# Patient Record
Sex: Female | Born: 1965 | Race: White | Marital: Married | Smoking: Never smoker
Health system: Southern US, Community
[De-identification: ages and names within clinical notes are randomized; demographics above are authoritative.]

## PROBLEM LIST (undated history)

## (undated) DIAGNOSIS — M51369 Other intervertebral disc degeneration, lumbar region without mention of lumbar back pain or lower extremity pain: Secondary | ICD-10-CM

## (undated) DIAGNOSIS — M5136 Other intervertebral disc degeneration, lumbar region: Secondary | ICD-10-CM

---
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
## 2007-08-01 HISTORY — PX: CERVICAL FUSION: SHX112

## 2010-11-12 ENCOUNTER — Other Ambulatory Visit: Payer: Self-pay | Admitting: Family Medicine

## 2010-11-12 DIAGNOSIS — M542 Cervicalgia: Secondary | ICD-10-CM

## 2010-11-18 ENCOUNTER — Ambulatory Visit
Admission: RE | Admit: 2010-11-18 | Discharge: 2010-11-18 | Disposition: A | Discharge status: Home / Self Care | Payer: BC Managed Care – PPO | Source: Ambulatory Visit | Attending: Family Medicine | Admitting: Family Medicine

## 2010-11-18 DIAGNOSIS — M542 Cervicalgia: Secondary | ICD-10-CM

## 2016-05-10 ENCOUNTER — Telehealth: Payer: Self-pay | Admitting: Rheumatology

## 2016-05-10 NOTE — Telephone Encounter (Signed)
Patient states she had a cervical fusion in 2009. Patient states she has chronic pain from this. Patient states she is seeing PT for an issue with her elbow. Patient states she was given a patch that administered Dexamethasone to the elbow area. Patient states the Physical Therapist would like to know if we can send in a prescription for a vial of Dexamethasone to the pharmacy for her to take with her to her appointments. She states the Physical therapist mentioned that he wonders if the pain could be in relation to her cervical pain and history of cervical injury and surgery. Patient states he suggested a possible MRI.

## 2016-05-10 NOTE — Telephone Encounter (Signed)
Patient states the neurosurgeon that performed [place] in [place]. Patient would liek to know who she should follow up with from here.

## 2016-05-10 NOTE — Telephone Encounter (Signed)
Patient has questions about a cervical spine fusion she had done years ago and is wondering if she should have an MRI. Patient is currently taking physical therapy and would like to touch base with Dr. Corliss Skainseveshwar or Mr. Leane Callanwala. Patient is requesting a call back today please.

## 2016-05-10 NOTE — Telephone Encounter (Signed)
Spoke with Onalee Huaavid. He faxed paperwork that needed to be signed by Dr. Corliss Skainseveshwar and the fax was returned.

## 2016-05-10 NOTE — Telephone Encounter (Signed)
[facility] called about needing eval notes signed off on by Dr. Corliss Skainseveshwar. Patient has scheduled appt at 12:30 today and they need this done prior to treating her. Please call Onalee HuaDavid back at [phone]

## 2016-05-10 NOTE — Telephone Encounter (Signed)
I am uncertain what the PT wants rx for. Please call them and ask the details. Usually the want rx for iontophoresis. We can give a prescription for iontophoresis if needed

## 2016-05-10 NOTE — Telephone Encounter (Signed)
If she wants to have MRI of her C-spine she should see her neurosurgeon.

## 2016-05-11 MED ORDER — DEXAMETHASONE SODIUM PHOSPHATE 4 MG/ML IJ SOLN: INTRAMUSCULAR | 0 refills | Status: DC

## 2016-05-11 NOTE — Telephone Encounter (Signed)
Please ask, which should be prescribed dosing, quantity etc.

## 2016-05-11 NOTE — Telephone Encounter (Signed)
Left message to advise patient prescription has been sent to the pharmacy.  

## 2016-05-11 NOTE — Telephone Encounter (Signed)
Spoke with Onalee Huaavid, PT at [facility]. He states that with the iontophoresis each patient has to have their own Dexamethasone. Patient is supposed to pick it up from pharmacy and have it on hand at the PT office. Requesting we send the prescription to the pharmacy if you are willing.

## 2016-05-11 NOTE — Addendum Note (Signed)
Addended by: Henriette CombsHATTON, Jaella Weinert L on: 05/11/2016 09:40 AM   Modules accepted: Orders

## 2016-05-11 NOTE — Addendum Note (Signed)
Addended by: Henriette CombsHATTON, Laterra Lubinski L on: 05/11/2016 01:11 PM   Modules accepted: Orders

## 2017-06-19 NOTE — Progress Notes (Signed)
[facility] [address] [place], [place] [address] Phone: [phone] Subjective:     CC: Lower back pain  BJY:NWGNFAOZHY  Jasmine Zimmerman is a 52 y.o. female coming in with complaint of low back pain. Patient has a cervical fusion from April 2009, C5-C7. She has been having back pain for about 8 months. Her pain is over the spine in the lumbar region. She does have pain into the sacrum and legs bilaterally to her thighs. She has pain in both anterior and posterior aspect of her legs. Pain is constant but is worse with prolonged sitting or lying down. She is having a hard time sleeping due to the pain. Patient had an MRI of lumbar spine in 2018.  MRI was independently visualized by me showing severe spinal stenosis at L3-L4.    Patient has a history of neck pain.  Has had C5 through C7 fusion in 2009.  Recently saw another provider from an outside facility and records were reviewed.  Patient was given Medrol Dosepak as well as muscle relaxer.  History reviewed. No pertinent past medical history. Past Surgical History:  Procedure Laterality Date  . APPENDECTOMY  1983  . CERVICAL FUSION  08/2007  . CESAREAN SECTION     Social History   Socioeconomic History  . Marital status: Married    Spouse name: None  . Number of children: None  . Years of education: None  . Highest education level: None  Social Needs  . Financial resource strain: None  . Food insecurity - worry: None  . Food insecurity - inability: None  . Transportation needs - medical: None  . Transportation needs - non-medical: None  Occupational History  . None  Tobacco Use  . Smoking status: None  Substance and Sexual Activity  . Alcohol use: None  . Drug use: None  . Sexual activity: None  Other Topics Concern  . None  Social History Narrative  . None   Not on File History reviewed. No pertinent family history.   Past medical history, social, surgical and family history all  reviewed in electronic medical record.  No pertanent information unless stated regarding to the chief complaint.   Review of Systems:Review of systems updated and as accurate as of 06/20/17  No headache, visual changes, nausea, vomiting, diarrhea, constipation, dizziness, abdominal pain, skin rash, fevers, chills, night sweats, weight loss, swollen lymph nodes, body aches, joint swelling, muscle aches, chest pain, shortness of breath, mood changes.   Objective  Blood pressure 104/74, pulse 70, height 5\' 4"  (1.626 m), weight 125 lb (56.7 kg), SpO2 90 %. Systems examined below as of 06/20/17   General: No apparent distress alert and oriented x3 mood and affect normal, dressed appropriately.  HEENT: Pupils equal, extraocular movements intact  Respiratory: Patient's speak in full sentences and does not appear short of breath  Cardiovascular: No lower extremity edema, non tender, no erythema  Skin: Warm dry intact with no signs of infection or rash on extremities or on axial skeleton.  Abdomen: Soft nontender  Neuro: Cranial nerves II through XII are intact, neurovascularly intact in all extremities with 2+ DTRs and 2+ pulses.  Lymph: No lymphadenopathy of posterior or anterior cervical chain or axillae bilaterally.  Gait normal with good balance and coordination.  MSK:  Non tender with full range of motion and good stability and symmetric strength and tone of shoulders, elbows, wrist, hip, knee and ankles bilaterally.  Neck: Inspection  No palpable stepoffs.  Negative Spurling's maneuver. Full neck range of motion Grip strength and sensation normal in bilateral hands Strength good C4 to T1 distribution No sensory change to C4 to T1 Negative Hoffman sign bilaterally Reflexes normal  Back Exam:  Inspection: loss of lordosis.  Motion: Flexion 40 deg, Extension 25 deg, Side Bending to 40 deg bilaterally,  Rotation to 45 deg bilaterally  SLR laying: Negative   XSLR laying: Negative    Palpable tenderness: TTP in lumbar paraspinal musculature. Marland Kitchen. FABER: negative. Sensory change: Gross sensation intact to all lumbar and sacral dermatomes.  Reflexes: 2+ at both patellar tendons, 2+ at achilles tendons, Babinski's downgoing.  Strength at foot  Plantar-flexion: 5/5 Dorsi-flexion: 5/5 Eversion: 5/5 Inversion: 5/5  Leg strength  Quad: 5/5 Hamstring: 5/5 Hip flexor: 5/5 Hip abductors: 4/5  Gait unremarkable.  Osteopathic findings  T3 extended rotated and side bent right inhaled third rib T9 extended rotated and side bent left L2 flexed rotated and side bent right Sacrum right on right     Impression and Recommendations:     This case required medical decision making of moderate complexity.      Note: This dictation was prepared with Dragon dictation along with smaller phrase technology. Any transcriptional errors that result from this process are unintentional.

## 2017-06-20 ENCOUNTER — Encounter: Payer: Self-pay | Admitting: Family Medicine

## 2017-06-20 ENCOUNTER — Ambulatory Visit (INDEPENDENT_AMBULATORY_CARE_PROVIDER_SITE_OTHER): Admitting: Family Medicine

## 2017-06-20 DIAGNOSIS — M999 Biomechanical lesion, unspecified: Secondary | ICD-10-CM | POA: Insufficient documentation

## 2017-06-20 DIAGNOSIS — M48062 Spinal stenosis, lumbar region with neurogenic claudication: Secondary | ICD-10-CM | POA: Diagnosis not present

## 2017-06-20 MED ORDER — VITAMIN D (ERGOCALCIFEROL) 1.25 MG (50000 UNIT) PO CAPS: 50000.0000 [IU] | ORAL_CAPSULE | ORAL | 0 refills | Status: DC

## 2017-06-20 MED ORDER — GABAPENTIN 100 MG PO CAPS: 200.0000 mg | ORAL_CAPSULE | Freq: Every day | ORAL | 3 refills | Status: DC

## 2017-06-20 NOTE — Assessment & Plan Note (Signed)
Moderate to severe  HEP given, discussed icing, started gabapentin and OMT  Responded well could be candidate of epidural if needed  RTC in 4 weeks

## 2017-06-20 NOTE — Patient Instructions (Signed)
Good to see you  Lets get you back active OK to bike and elliptical mostly.  Exercises 3 times a week.  Gabapentin 200mg  at night Once weekly vitamin D for 12 weeks.  Avoid back extensions.  Split the fish oil and turmeric and move tart cherry to nighttime as well.  Tried manipulation and I think you will do well but will take time See em again in 3-4 weeks

## 2017-06-20 NOTE — Assessment & Plan Note (Signed)
Decision today to treat with OMT was based on Physical Exam  After verbal consent patient was treated with HVLA, ME techniques in thoracic, lumbar and sacral areas  Patient tolerated the procedure well with improvement in symptoms  Patient given exercises, stretches and lifestyle modifications  See medications in patient instructions if given  Patient will follow up in 4 weeks    

## 2017-07-18 ENCOUNTER — Encounter: Payer: Self-pay | Admitting: Family Medicine

## 2017-07-18 ENCOUNTER — Ambulatory Visit (INDEPENDENT_AMBULATORY_CARE_PROVIDER_SITE_OTHER): Admitting: Family Medicine

## 2017-07-18 VITALS — BP 106/80 | HR 68 | Ht 64.0 in | Wt 125.0 lb

## 2017-07-18 DIAGNOSIS — M999 Biomechanical lesion, unspecified: Secondary | ICD-10-CM | POA: Diagnosis not present

## 2017-07-18 DIAGNOSIS — M48062 Spinal stenosis, lumbar region with neurogenic claudication: Secondary | ICD-10-CM

## 2017-07-18 MED ORDER — VITAMIN D (ERGOCALCIFEROL) 1.25 MG (50000 UNIT) PO CAPS: 50000.0000 [IU] | ORAL_CAPSULE | ORAL | 0 refills | Status: DC

## 2017-07-18 MED ORDER — GABAPENTIN 100 MG PO CAPS: 200.0000 mg | ORAL_CAPSULE | Freq: Every day | ORAL | 3 refills | Status: DC

## 2017-07-18 NOTE — Progress Notes (Signed)
Jasmine Zimmerman [address] [place], [place] [address] Phone: [phone] Subjective:      CC: Back pain follow-up  BJY:NWGNFAOZHY  Jasmine Zimmerman is a 52 y.o. female coming in with complaint of back pain.  Found to have an L4-L5 spinal stenosis.  Started on gabapentin which patient has done fairly well.  Patient does not have any neurogenic claudication at the moment.  He has not been quite as active though secondary to having a recent respiratory illness.  Patient's back did feel significantly better after the prednisone.  Patient has been trying to increase activity but is only slowly doing it.  Denies any numbness or tingling.    No past medical history on file. Past Surgical History:  Procedure Laterality Date  . APPENDECTOMY  1983  . CERVICAL FUSION  08/2007  . CESAREAN SECTION     Social History   Socioeconomic History  . Marital status: Married    Spouse name: None  . Number of children: None  . Years of education: None  . Highest education level: None  Social Needs  . Financial resource strain: None  . Food insecurity - worry: None  . Food insecurity - inability: None  . Transportation needs - medical: None  . Transportation needs - non-medical: None  Occupational History  . None  Tobacco Use  . Smoking status: Never Smoker  . Smokeless tobacco: Never Used  Substance and Sexual Activity  . Alcohol use: None  . Drug use: None  . Sexual activity: None  Other Topics Concern  . None  Social History Narrative  . None   Not on File No family history on file.   Past medical history, social, surgical and family history all reviewed in electronic medical record.  No pertanent information unless stated regarding to the chief complaint.   Review of Systems:Review of systems updated and as accurate as of 07/18/17  No headache, visual changes, nausea, vomiting, diarrhea, constipation, dizziness, abdominal pain, skin rash, fevers, chills, night  sweats, weight loss, swollen lymph nodes, body aches, joint swelling, muscle aches, chest pain, shortness of breath, mood changes.   Objective  Blood pressure 106/80, pulse 68, height 5\' 4"  (1.626 m), weight 125 lb (56.7 kg), SpO2 99 %. Systems examined below as of 07/18/17   General: No apparent distress alert and oriented x3 mood and affect normal, dressed appropriately.  HEENT: Pupils equal, extraocular movements intact  Respiratory: Patient's speak in full sentences and does not appear short of breath  Cardiovascular: No lower extremity edema, non tender, no erythema  Skin: Warm dry intact with no signs of infection or rash on extremities or on axial skeleton.  Abdomen: Soft nontender  Neuro: Cranial nerves II through XII are intact, neurovascularly intact in all extremities with 2+ DTRs and 2+ pulses.  Lymph: No lymphadenopathy of posterior or anterior cervical chain or axillae bilaterally.  Gait normal with good balance and coordination.  MSK:  Non tender with full range of motion and good stability and symmetric strength and tone of shoulders, elbows, wrist, hip, knee and ankles bilaterally.  Very mild arthritic changes of multiple joints Back Exam:  Inspection: Loss of lordosis Motion: Flexion 45 deg, Extension 30 deg, Side Bending to 30 deg bilaterally,  Rotation to 45 deg bilaterally  SLR laying: Negative  XSLR laying: Negative  Palpable tenderness: Mild tenderness to palpation the paraspinal musculature lumbar spine.Marland Kitchen FABER: Tightness bilaterally. Sensory change: Gross sensation intact to all lumbar and sacral  dermatomes.  Reflexes: 2+ at both patellar tendons, 2+ at achilles tendons, Babinski's downgoing.  Strength at foot  Plantar-flexion: 5/5 Dorsi-flexion: 5/5 Eversion: 5/5 Inversion: 5/5  Leg strength  Quad: 5/5 Hamstring: 5/5 Hip flexor: 5/5 Hip abductors: 5/5  Gait unremarkable.  Osteopathic findings T9 extended rotated and side bent left L2 flexed rotated and side  bent right Sacrum right on right     Impression and Recommendations:     This case required medical decision making of moderate complexity.      Note: This dictation was prepared with Dragon dictation along with smaller phrase technology. Any transcriptional errors that result from this process are unintentional.

## 2017-07-18 NOTE — Patient Instructions (Addendum)
Great to see you  I think you are doing well.  Ice is your friend.  Continue the vitamin D  Gabapentin 200mg  daily  Keep up with the exercises  OK to increase activity a little  See me again in 4-5 weeks

## 2017-07-18 NOTE — Assessment & Plan Note (Signed)
Known to have spinal stenosis.  May need possible epidurals.  Patient will continue with home exercises and icing regimen.  Encourage patient to continue the gabapentin.  Continue to once weekly vitamin D.  Attempted osteopathic manipulation again.  Follow-up again in 4-6 weeks

## 2017-07-18 NOTE — Assessment & Plan Note (Signed)
Decision today to treat with OMT was based on Physical Exam  After verbal consent patient was treated with HVLA, ME, FPR techniques in  thoracic, lumbar and sacral areas  Patient tolerated the procedure well with improvement in symptoms  Patient given exercises, stretches and lifestyle modifications  See medications in patient instructions if given  Patient will follow up in 4-6 weeks 

## 2017-08-16 ENCOUNTER — Ambulatory Visit: Admitting: Family Medicine

## 2017-08-31 ENCOUNTER — Telehealth: Payer: Self-pay | Admitting: Family Medicine

## 2017-08-31 DIAGNOSIS — M999 Biomechanical lesion, unspecified: Secondary | ICD-10-CM

## 2017-08-31 NOTE — Telephone Encounter (Signed)
Copied from CRM [id number]. Topic: Quick Communication - See Telephone Encounter >> Aug 31, 2017  2:21 PM Cipriano Bunker wrote: CRM for notification. See Telephone encounter for: 08/31/17.  Pt. Is asking if Dr. Antoine Primas can call in something to help her for her sciatica pain she is having . She is taking gabapentin at night but not helping.  She is having a lot of spasms and sharp pain down her leg. She is loosing control of right foot and no appt until May 24 and she can not come that day.  And does not think she can wait that long.   [facility] [place], [place] - [address] [place] [address] Phone: [phone] Fax: [phone]

## 2017-08-31 NOTE — Telephone Encounter (Signed)
Pt asking if Dr. Antoine Primas could call something in to help with sciatica pain.    LOV: 07/18/17 with Dr. Antoine Primas

## 2017-09-01 NOTE — Telephone Encounter (Signed)
Left message for patient to call back. Per a verbal from Dr. Katrinka Blazing patient can get an epidural in L4/L5, be seen by Dr. Jordan Likes next week, or should go to the ER if she cannot manage her pain. Will relay this message to patient when she returns my call.

## 2017-09-04 MED ORDER — PREDNISONE 50 MG PO TABS: ORAL_TABLET | ORAL | 0 refills | Status: DC

## 2017-09-04 NOTE — Telephone Encounter (Signed)
Spoke with pt, sent in order for epidural to [facility]. Pt states she will not be able to get epidural done this week due to her dog having ACL surgery. Sent in prednisone  #5 to pharmacy.

## 2017-09-08 ENCOUNTER — Encounter: Payer: Self-pay | Admitting: Radiology

## 2017-09-08 ENCOUNTER — Ambulatory Visit
Admission: RE | Admit: 2017-09-08 | Discharge: 2017-09-08 | Disposition: A | Discharge status: Home / Self Care | Source: Ambulatory Visit | Attending: Family Medicine | Admitting: Family Medicine

## 2017-09-08 DIAGNOSIS — M999 Biomechanical lesion, unspecified: Secondary | ICD-10-CM

## 2017-09-08 MED ORDER — METHYLPREDNISOLONE ACETATE 40 MG/ML INJ SUSP (RADIOLOG
120.0000 mg | Freq: Once | INTRAMUSCULAR | Status: AC
  Administered 2017-09-08: 120 mg via EPIDURAL

## 2017-09-08 MED ORDER — IOPAMIDOL (ISOVUE-M 200) INJECTION 41%
1.0000 mL | Freq: Once | INTRAMUSCULAR | Status: AC
  Administered 2017-09-08: 1 mL via EPIDURAL

## 2017-09-08 NOTE — Discharge Instructions (Signed)

## 2017-09-20 NOTE — Progress Notes (Signed)
Jasmine Zimmerman, Jasmine Zimmerman Phone: [phone] Subjective:    CC: Low back pain  BJY:NWGNFAOZHY  Jasmine Zimmerman is a 52 y.o. female coming in with complaint of back pain. She had minimal improvement after the epidural. Her pain has begun to radiate down the right leg with forward flexion. Patient has been having a hard time keeping her foot on the brake with her leg extended.  Patient states that this seems to be worsening because she is also having increasing stress recently.     Patient's MRI from 2018 did show moderate to severe spinal stenosis L4-L5  No past medical history on file. Past Surgical History:  Procedure Laterality Date  . APPENDECTOMY  1983  . CERVICAL FUSION  08/2007  . CESAREAN SECTION     Social History   Socioeconomic History  . Marital status: Married    Spouse name: Not on file  . Number of children: Not on file  . Years of education: Not on file  . Highest education level: Not on file  Occupational History  . Not on file  Social Needs  . Financial resource strain: Not on file  . Food insecurity:    Worry: Not on file    Inability: Not on file  . Transportation needs:    Medical: Not on file    Non-medical: Not on file  Tobacco Use  . Smoking status: Never Smoker  . Smokeless tobacco: Never Used  Substance and Sexual Activity  . Alcohol use: Not on file  . Drug use: Not on file  . Sexual activity: Not on file  Lifestyle  . Physical activity:    Days per week: Not on file    Minutes per session: Not on file  . Stress: Not on file  Relationships  . Social connections:    Talks on phone: Not on file    Gets together: Not on file    Attends religious service: Not on file    Active member of club or organization: Not on file    Attends meetings of clubs or organizations: Not on file    Relationship status: Not on file  Other Topics Concern  . Not on file  Social History Narrative  . Not  on file   No Known Allergies No family history on file.  No family history   Past medical history, social, surgical and family history all reviewed in electronic medical record.  No pertanent information unless stated regarding to the chief complaint.   Review of Systems:Review of systems updated and as accurate as of 09/21/17  No headache, visual changes, nausea, vomiting, diarrhea, constipation, dizziness, abdominal pain, skin rash, fevers, chills, night sweats, weight loss, swollen lymph nodes, body aches, joint swelling,  chest pain, shortness of breath, mood changes.  Positive muscle aches  Objective  Blood pressure 102/72, pulse 69, height  (1.626 m), weight 125 lb (56.7 kg), SpO2 99 %. Systems examined below as of 09/21/17   General: No apparent distress alert and oriented x3 mood and affect normal, dressed appropriately.  HEENT: Pupils equal, extraocular movements intact  Respiratory: Patient's speak in full sentences and does not appear short of breath  Cardiovascular: No lower extremity edema, non tender, no erythema  Skin: Warm dry intact with no signs of infection or rash on extremities or on axial skeleton.  Abdomen: Soft nontender  Neuro: Cranial nerves II through XII are intact, neurovascularly intact in all extremities  with 2+ DTRs and 2+ pulses.  Lymph: No lymphadenopathy of posterior or anterior cervical chain or axillae bilaterally.  Gait normal with good balance and coordination.  MSK:  Non tender with full range of motion and good stability and symmetric strength and tone of shoulders, elbows, wrist, hip, knee and ankles bilaterally.  Back Exam:  Inspection: Mild loss of lordosis Motion: Flexion 35 deg with tightness, Extension 25 deg, Side Bending to 35 deg bilaterally,  Rotation to 35 deg bilaterally  SLR laying: Positive right XSLR laying: Negative  Palpable tenderness: Tender to palpation the paraspinal musculature lumbar spine right greater than  left. FABER: Positive right. Sensory change: Gross sensation intact to all lumbar and sacral dermatomes.  Reflexes: 2+ at both patellar tendons, 2+ at achilles tendons, Babinski's downgoing.  Strength at foot  Mild weakness in the right side with dorsiflexion and plantarflexion of  Osteopathic findings C2 flexed rotated and side bent right C4 flexed rotated and side bent left C7 flexed rotated and side bent left T3 extended rotated and side bent right inhaled third rib T9 extended rotated and side bent left L2 flexed rotated and side bent right Sacrum right on right     Impression and Recommendations:     This case required medical decision making of moderate complexity.      Note: This dictation was prepared with Dragon dictation along with smaller phrase technology. Any transcriptional errors that result from this process are unintentional.

## 2017-09-21 ENCOUNTER — Encounter: Payer: Self-pay | Admitting: Family Medicine

## 2017-09-21 ENCOUNTER — Ambulatory Visit (INDEPENDENT_AMBULATORY_CARE_PROVIDER_SITE_OTHER): Admitting: Family Medicine

## 2017-09-21 ENCOUNTER — Other Ambulatory Visit (INDEPENDENT_AMBULATORY_CARE_PROVIDER_SITE_OTHER)

## 2017-09-21 VITALS — BP 102/72 | HR 69 | Ht 64.0 in | Wt 125.0 lb

## 2017-09-21 DIAGNOSIS — M2550 Pain in unspecified joint: Secondary | ICD-10-CM

## 2017-09-21 DIAGNOSIS — M999 Biomechanical lesion, unspecified: Secondary | ICD-10-CM

## 2017-09-21 DIAGNOSIS — M48062 Spinal stenosis, lumbar region with neurogenic claudication: Secondary | ICD-10-CM | POA: Diagnosis not present

## 2017-09-21 LAB — CBC WITH DIFFERENTIAL/PLATELET
BASOS ABS: 0.1 10*3/uL (ref 0.0–0.1)
Basophils Relative: 1.3 % (ref 0.0–3.0)
EOS PCT: 2.5 % (ref 0.0–5.0)
Eosinophils Absolute: 0.1 10*3/uL (ref 0.0–0.7)
HEMATOCRIT: 39.2 % (ref 36.0–46.0)
HEMOGLOBIN: 13.3 g/dL (ref 12.0–15.0)
LYMPHS ABS: 1.3 10*3/uL (ref 0.7–4.0)
Lymphocytes Relative: 25.7 % (ref 12.0–46.0)
MCHC: 34 g/dL (ref 30.0–36.0)
MCV: 92.2 fl (ref 78.0–100.0)
MONOS PCT: 5.6 % (ref 3.0–12.0)
Monocytes Absolute: 0.3 10*3/uL (ref 0.1–1.0)
Neutro Abs: 3.4 10*3/uL (ref 1.4–7.7)
Neutrophils Relative %: 64.9 % (ref 43.0–77.0)
Platelets: 198 10*3/uL (ref 150.0–400.0)
RBC: 4.25 Mil/uL (ref 3.87–5.11)
RDW: 13.4 % (ref 11.5–15.5)
WBC: 5.3 10*3/uL (ref 4.0–10.5)

## 2017-09-21 LAB — VITAMIN D 25 HYDROXY (VIT D DEFICIENCY, FRACTURES): VITD: 56.75 ng/mL (ref 30.00–100.00)

## 2017-09-21 LAB — C-REACTIVE PROTEIN: CRP: <0.1 mg/dL — ABNORMAL LOW (ref 0.5–20.0)

## 2017-09-21 LAB — IBC PANEL
Iron: 80 ug/dL (ref 42–145)
SATURATION RATIOS: 17.2 % — AB (ref 20.0–50.0)
TRANSFERRIN: 333 mg/dL (ref 212.0–360.0)

## 2017-09-21 LAB — URIC ACID: URIC ACID, SERUM: 3.6 mg/dL (ref 2.4–7.0)

## 2017-09-21 LAB — VITAMIN B12: VITAMIN B 12: 642 pg/mL (ref 211–911)

## 2017-09-21 LAB — TSH: TSH: 2.1 u[IU]/mL (ref 0.35–4.50)

## 2017-09-21 LAB — SEDIMENTATION RATE: SED RATE: 3 mm/h (ref 0–30)

## 2017-09-21 MED ORDER — VENLAFAXINE HCL ER 37.5 MG PO CP24: 37.5000 mg | ORAL_CAPSULE | Freq: Every day | ORAL | 1 refills | Status: DC

## 2017-09-21 NOTE — Patient Instructions (Signed)
Good to see you  Effexor 37.5 mg daily  Stay active Try to find time for yourself Labs today  Consider piriformis injection  Play with the gabapentin at night and can try during the day  See me again in 3-4 weeks

## 2017-09-21 NOTE — Assessment & Plan Note (Signed)
Patient did not respond well to the injection.  We discussed icing regimen and home exercise.  Discussed the possibility of piriformis injection.  Still will attempt osteopathic manipulation.

## 2017-09-21 NOTE — Assessment & Plan Note (Signed)
Decision today to treat with OMT was based on Physical Exam  After verbal consent patient was treated with HVLA, ME, FPR techniques in cervical, thoracic, lumbar and sacral areas  Patient tolerated the procedure well with improvement in symptoms  Patient given exercises, stretches and lifestyle modifications  See medications in patient instructions if given  Patient will follow up in 4-8 weeks 

## 2017-09-26 LAB — PTH, INTACT AND CALCIUM
Calcium: 9.9 mg/dL (ref 8.6–10.4)
PTH: 28 pg/mL (ref 14–64)

## 2017-09-26 LAB — CYCLIC CITRUL PEPTIDE ANTIBODY, IGG: Cyclic Citrullin Peptide Ab: <16 UNITS

## 2017-09-26 LAB — SJOGRENS SYNDROME-A EXTRACTABLE NUCLEAR ANTIBODY: SSA (Ro) (ENA) Antibody, IgG: 1.2 AI — AB

## 2017-09-26 LAB — ANA: Anti Nuclear Antibody(ANA): POSITIVE — AB

## 2017-09-26 LAB — RHEUMATOID FACTOR: Rhuematoid fact SerPl-aCnc: <14 IU/mL (ref <14)

## 2017-09-26 LAB — ANTI-NUCLEAR AB-TITER (ANA TITER)

## 2017-09-26 LAB — ANGIOTENSIN CONVERTING ENZYME: ANGIOTENSIN-CONVERTING ENZYME: 27 U/L (ref 9–67)

## 2017-09-26 LAB — SJOGRENS SYNDROME-B EXTRACTABLE NUCLEAR ANTIBODY: SSB (La) (ENA) Antibody, IgG: <1 AI

## 2017-10-23 NOTE — Progress Notes (Deleted)
Tawana ScaleZach Smith D.O. [facility] [address] [place], [place] [address] Phone: [phone]) [phone] Subjective:    I'm seeing this patient by the request  of:    CC:   WGN:FAOZHYQMVHHPI:Subjective  Jasmine CadetSara Zimmerman is a 52 y.o. female coming in with complaint of ***  Onset-  Location Duration-  Character- Aggravating factors- Reliving factors-  Therapies tried-  Severity-     No past medical history on file. Past Surgical History:  Procedure Laterality Date  . APPENDECTOMY  1983  . CERVICAL FUSION  08/2007  . CESAREAN SECTION     Social History   Socioeconomic History  . Marital status: Married    Spouse name: Not on file  . Number of children: Not on file  . Years of education: Not on file  . Highest education level: Not on file  Occupational History  . Not on file  Social Needs  . Financial resource strain: Not on file  . Food insecurity:    Worry: Not on file    Inability: Not on file  . Transportation needs:    Medical: Not on file    Non-medical: Not on file  Tobacco Use  . Smoking status: Never Smoker  . Smokeless tobacco: Never Used  Substance and Sexual Activity  . Alcohol use: Not on file  . Drug use: Not on file  . Sexual activity: Not on file  Lifestyle  . Physical activity:    Days per week: Not on file    Minutes per session: Not on file  . Stress: Not on file  Relationships  . Social connections:    Talks on phone: Not on file    Gets together: Not on file    Attends religious service: Not on file    Active member of club or organization: Not on file    Attends meetings of clubs or organizations: Not on file    Relationship status: Not on file  Other Topics Concern  . Not on file  Social History Narrative  . Not on file   No Known Allergies No family history on file.   Past medical history, social, surgical and family history all reviewed in electronic medical record.  No pertanent information unless stated regarding to the chief complaint.    Review of Systems:Review of systems updated and as accurate as of 10/23/17  No headache, visual changes, nausea, vomiting, diarrhea, constipation, dizziness, abdominal pain, skin rash, fevers, chills, night sweats, weight loss, swollen lymph nodes, body aches, joint swelling, muscle aches, chest pain, shortness of breath, mood changes.   Objective  There were no vitals taken for this visit. Systems examined below as of 10/23/17   General: No apparent distress alert and oriented x3 mood and affect normal, dressed appropriately.  HEENT: Pupils equal, extraocular movements intact  Respiratory: Patient's speak in full sentences and does not appear short of breath  Cardiovascular: No lower extremity edema, non tender, no erythema  Skin: Warm dry intact with no signs of infection or rash on extremities or on axial skeleton.  Abdomen: Soft nontender  Neuro: Cranial nerves II through XII are intact, neurovascularly intact in all extremities with 2+ DTRs and 2+ pulses.  Lymph: No lymphadenopathy of posterior or anterior cervical chain or axillae bilaterally.  Gait normal with good balance and coordination.  MSK:  Non tender with full range of motion and good stability and symmetric strength and tone of shoulders, elbows, wrist, hip, knee and ankles bilaterally.  Impression and Recommendations:     This case required medical decision making of moderate complexity.      Note: This dictation was prepared with Dragon dictation along with smaller phrase technology. Any transcriptional errors that result from this process are unintentional.

## 2017-10-24 ENCOUNTER — Ambulatory Visit: Admitting: Family Medicine

## 2017-11-01 ENCOUNTER — Other Ambulatory Visit: Payer: Self-pay | Admitting: Family Medicine

## 2017-11-01 NOTE — Telephone Encounter (Signed)
Refill done.  

## 2017-11-27 NOTE — Progress Notes (Signed)
Tawana ScaleZach Khiley Lieser D.O. [facility] [address] [place], [place] [address] Phone: [phone] Subjective:       CC: Back pain  BJY:[id number]:Subjective  Jasmine Zimmerman is a 52 y.o. female coming in with complaint of back pain. She states that she was doing well but her pain has come back in the last week. She is using the Effexor and feels like it is helping. She has been washing her dog and caring for him which puts her in a lumbar flexion position.  Patient does have known spinal stenosis at L4-L5.  Has been responding well to the Effexor and at this point.  Feels like she is making progress.  Still some mild discomfort and pain but only intermittent radicular symptoms.  No weakness.  Overall making progress.     No past medical history on file. Past Surgical History:  Procedure Laterality Date  . APPENDECTOMY  1983  . CERVICAL FUSION  08/2007  . CESAREAN SECTION     Social History   Socioeconomic History  . Marital status: Married    Spouse name: Not on file  . Number of children: Not on file  . Years of education: Not on file  . Highest education level: Not on file  Occupational History  . Not on file  Social Needs  . Financial resource strain: Not on file  . Food insecurity:    Worry: Not on file    Inability: Not on file  . Transportation needs:    Medical: Not on file    Non-medical: Not on file  Tobacco Use  . Smoking status: Never Smoker  . Smokeless tobacco: Never Used  Substance and Sexual Activity  . Alcohol use: Not on file  . Drug use: Not on file  . Sexual activity: Not on file  Lifestyle  . Physical activity:    Days per week: Not on file    Minutes per session: Not on file  . Stress: Not on file  Relationships  . Social connections:    Talks on phone: Not on file    Gets together: Not on file    Attends religious service: Not on file    Active member of club or organization: Not on file    Attends meetings of clubs or organizations: Not on file   Relationship status: Not on file  Other Topics Concern  . Not on file  Social History Narrative  . Not on file   No Known Allergies No family history on file.  No family history of autoimmune   Past medical history, social, surgical and family history all reviewed in electronic medical record.  No pertanent information unless stated regarding to the chief complaint.   Review of Systems:Review of systems updated and as accurate as of 11/28/17  No headache, visual changes, nausea, vomiting, diarrhea, constipation, dizziness, abdominal pain, skin rash, fevers, chills, night sweats, weight loss, swollen lymph nodes, body aches, joint swelling, chest pain, shortness of breath, mood changes.  Positive muscle aches  Objective  Blood pressure 112/78, pulse 67, height 5\' 4"  (1.626 m), weight 135 lb (61.2 kg), SpO2 98 %. Systems examined below as of 11/28/17   General: No apparent distress alert and oriented x3 mood and affect normal, dressed appropriately.  HEENT: Pupils equal, extraocular movements intact  Respiratory: Patient's speak in full sentences and does not appear short of breath  Cardiovascular: No lower extremity edema, non tender, no erythema  Skin: Warm dry intact with no signs of  infection or rash on extremities or on axial skeleton.  Abdomen: Soft nontender  Neuro: Cranial nerves II through XII are intact, neurovascularly intact in all extremities with 2+ DTRs and 2+ pulses.  Lymph: No lymphadenopathy of posterior or anterior cervical chain or axillae bilaterally.  Gait normal with good balance and coordination.  MSK:  Non tender with full range of motion and good stability and symmetric strength and tone of shoulders, elbows, wrist, hip, knee and ankles bilaterally.  Back Exam:  Inspection: Mild loss of lordosis Motion: Flexion 45 deg, Extension 15 deg, Side Bending to 25 deg bilaterally,  Rotation to 30 deg bilaterally  SLR laying: Negative  XSLR laying: Negative  Palpable  tenderness: Tender to palpation in the paraspinal musculature lumbar spine right greater than left. FABER: negative. Sensory change: Gross sensation intact to all lumbar and sacral dermatomes.  Reflexes: 2+ at both patellar tendons, 2+ at achilles tendons, Babinski's downgoing.  Strength at foot  Plantar-flexion: 5/5 Dorsi-flexion: 5/5 Eversion: 5/5 Inversion: 5/5  Leg strength  Quad: 5/5 Hamstring: 5/5 Hip flexor: 5/5 Hip abductors: 5/5  Gait unremarkable.  Osteopathic findings T3 extended rotated and side bent left L3 flexed rotated and side bent right Sacrum right on right    Impression and Recommendations:     This case required medical decision making of moderate complexity.      Note: This dictation was prepared with Dragon dictation along with smaller phrase technology. Any transcriptional errors that result from this process are unintentional.

## 2017-11-28 ENCOUNTER — Encounter: Payer: Self-pay | Admitting: Family Medicine

## 2017-11-28 ENCOUNTER — Ambulatory Visit (INDEPENDENT_AMBULATORY_CARE_PROVIDER_SITE_OTHER): Admitting: Family Medicine

## 2017-11-28 VITALS — BP 112/78 | HR 67 | Ht 64.0 in | Wt 135.0 lb

## 2017-11-28 DIAGNOSIS — M48062 Spinal stenosis, lumbar region with neurogenic claudication: Secondary | ICD-10-CM

## 2017-11-28 DIAGNOSIS — M999 Biomechanical lesion, unspecified: Secondary | ICD-10-CM

## 2017-11-28 NOTE — Assessment & Plan Note (Signed)
Decision today to treat with OMT was based on Physical Exam  After verbal consent patient was treated with HVLA, ME, FPR techniques in  thoracic, lumbar and sacral areas  Patient tolerated the procedure well with improvement in symptoms  Patient given exercises, stretches and lifestyle modifications  See medications in patient instructions if given  Patient will follow up in 8 weeks 

## 2017-11-28 NOTE — Patient Instructions (Signed)
Good to see you  Have a good trip  Ice is your friend.  Effexor 75mg  daily for a week then write me and I will fill at higher dose  See me again in 6-8 weeks

## 2017-11-28 NOTE — Assessment & Plan Note (Signed)
Doing well with conservative therapy.  Will consider increasing Effexor to 75 mg.  We discussed icing regimen and home exercises.  Discussed which activities to do which wants to avoid.  Patient will consider this.  We discussed icing regimen.  Topical anti-inflammatories.  Still responding well to manipulation.  Follow-up again in 8 weeks

## 2017-12-19 ENCOUNTER — Telehealth: Payer: Self-pay | Admitting: Family Medicine

## 2017-12-19 NOTE — Telephone Encounter (Signed)
Copied from CRM [phone]. Topic: Quick Communication - Rx Refill/Question >> Dec 19, 2017  1:55 PM Maia Pettiesrtiz, Kristie S wrote: Medication: venlafaxine XR (EFFEXOR-XR) 37.5 MG 24 hr capsule - pt states she can't tell that 2 capsules makes a difference - she was taking 2/day for 2 weeks - so she will stick with 37.5mg  per day - she will need a refill "early" based on the RX.

## 2017-12-19 NOTE — Telephone Encounter (Signed)
Pt is calling and she does not need a refill. Pt has 35 pills left

## 2018-01-09 NOTE — Progress Notes (Signed)
[facility] [address] [place], [place] [address] Phone: [phone] Subjective:    I Ronelle Nigh am serving as a Neurosurgeon for Dr. Antoine Primas.   CC: Low back pain follow-up  OZH:[id number]  Jasmine Zimmerman is a 52 y.o. female coming in with complaint of hip pain. States that her hip is painful. Feels that her progress is moving backwards. Has been feeling lower back and bilateral hip pain. Changing positions during sleep is painful  Patient has had more low back pain.  Has known spinal stenosis.  Patient has had some radiation down the legs on a more regular basis as well.  Patient says that the pain is continued to be chronic.  Patient's mother is died       No past medical history on file. Past Surgical History:  Procedure Laterality Date  . APPENDECTOMY  1983  . CERVICAL FUSION  08/2007  . CESAREAN SECTION     Social History   Socioeconomic History  . Marital status: Married    Spouse name: Not on file  . Number of children: Not on file  . Years of education: Not on file  . Highest education level: Not on file  Occupational History  . Not on file  Social Needs  . Financial resource strain: Not on file  . Food insecurity:    Worry: Not on file    Inability: Not on file  . Transportation needs:    Medical: Not on file    Non-medical: Not on file  Tobacco Use  . Smoking status: Never Smoker  . Smokeless tobacco: Never Used  Substance and Sexual Activity  . Alcohol use: Not on file  . Drug use: Not on file  . Sexual activity: Not on file  Lifestyle  . Physical activity:    Days per week: Not on file    Minutes per session: Not on file  . Stress: Not on file  Relationships  . Social connections:    Talks on phone: Not on file    Gets together: Not on file    Attends religious service: Not on file    Active member of club or organization: Not on file    Attends meetings of clubs or organizations: Not on file    Relationship  status: Not on file  Other Topics Concern  . Not on file  Social History Narrative  . Not on file   No Known Allergies No family history on file.  Current Outpatient Medications (Endocrine & Metabolic):  .  dexamethasone (DECADRON) 4 MG/ML injection, To be used with Iontophoresis (Patient not taking: Reported on 01/10/2018) .  predniSONE (DELTASONE) 50 MG tablet, Take 1 tablet daily. (Patient not taking: Reported on 01/10/2018)      Current Outpatient Medications (Other):  .  gabapentin (NEURONTIN) 100 MG capsule, Take 2 capsules (200 mg total) by mouth at bedtime. Marland Kitchen  venlafaxine XR (EFFEXOR-XR) 37.5 MG 24 hr capsule, TAKE 1 CAPSULE BY MOUTH EVERY DAY WITH BREAKFAST .  Vitamin D, Ergocalciferol, (DRISDOL) 50000 units CAPS capsule, Take 1 capsule (50,000 Units total) by mouth every 7 (seven) days.    Past medical history, social, surgical and family history all reviewed in electronic medical record.  No pertanent information unless stated regarding to the chief complaint.   Review of Systems:  No headache, visual changes, nausea, vomiting, diarrhea, constipation, dizziness, abdominal pain, skin rash, fevers, chills, night sweats, weight loss, swollen lymph nodes, body aches, joint swelling,  muscle aches, chest pain, shortness of breath, mood changes.   Objective  Blood pressure 117/70, pulse 76, height 5\' 4"  (1.626 m), weight 136 lb (61.7 kg), SpO2 98 %.    General: No apparent distress alert and oriented x3 mood and affect normal, dressed appropriately.  HEENT: Pupils equal, extraocular movements intact  Respiratory: Patient's speak in full sentences and does not appear short of breath  Cardiovascular: No lower extremity edema, non tender, no erythema  Skin: Warm dry intact with no signs of infection or rash on extremities or on axial skeleton.  Abdomen: Soft nontender  Neuro: Cranial nerves II through XII are intact, neurovascularly intact in all extremities with 2+ DTRs and 2+  pulses.  Lymph: No lymphadenopathy of posterior or anterior cervical chain or axillae bilaterally.  Gait normal with good balance and coordination.  MSK:  Non tender with full range of motion and good stability and symmetric strength and tone of shoulders, elbows, wrist, hip, knee and ankles bilaterally.  Back Exam:  Inspection: Loss of lordosis Motion: Flexion 40 deg, Extension 25 deg, Side Bending to 35 deg bilaterally,  Rotation to 35 deg bilaterally  SLR laying: Negative significant tightness XSLR laying: Negative  Palpable tenderness: Tender to palpation of the paraspinal musculature.Marland Kitchen FABER: Positive right. Sensory change: Gross sensation intact to all lumbar and sacral dermatomes.  Reflexes: 2+ at both patellar tendons, 2+ at achilles tendons, Babinski's downgoing.  Strength at foot  Plantar-flexion: 5/5 Dorsi-flexion: 5/5 Eversion: 5/5 Inversion: 5/5  Leg strength  Quad: 5/5 Hamstring: 5/5 Hip flexor: 5/5 Hip abductors: 5/5  Gait unremarkable.  Procedure: Real-time Ultrasound Guided Injection of right sacroiliac joint Device: GE Logiq Q7 Ultrasound guided injection is preferred based studies that show increased duration, increased effect, greater accuracy, decreased procedural pain, increased response rate, and decreased cost with ultrasound guided versus blind injection.  Verbal informed consent obtained.  Time-out conducted.  Noted no overlying erythema, induration, or other signs of local infection.  Skin prepped in a sterile fashion.  Local anesthesia: Topical Ethyl chloride.  With sterile technique and under real time ultrasound guidance: Patient's with a 21-gauge 2 inch needle was injected with 0.5 cc of 0.5% Marcaine and 1 cc of Kenalog 40 mg/mL into the right sacroiliac Completed without difficulty  Pain immediately resolved suggesting accurate placement of the medication.  Advised to call if fevers/chills, erythema, induration, drainage, or persistent bleeding.    Images permanently stored and available for review in the ultrasound unit.  Impression: Technically successful ultrasound guided injection.   Impression and Recommendations:     This case required medical decision making of moderate complexity. The above documentation has been reviewed and is accurate and complete Judi Saa, DO       Note: This dictation was prepared with Dragon dictation along with smaller phrase technology. Any transcriptional errors that result from this process are unintentional.

## 2018-01-10 ENCOUNTER — Encounter: Payer: Self-pay | Admitting: Family Medicine

## 2018-01-10 ENCOUNTER — Ambulatory Visit (INDEPENDENT_AMBULATORY_CARE_PROVIDER_SITE_OTHER): Admitting: Family Medicine

## 2018-01-10 ENCOUNTER — Ambulatory Visit: Payer: Self-pay

## 2018-01-10 VITALS — BP 117/70 | HR 76 | Ht 64.0 in | Wt 136.0 lb

## 2018-01-10 DIAGNOSIS — M533 Sacrococcygeal disorders, not elsewhere classified: Secondary | ICD-10-CM

## 2018-01-10 DIAGNOSIS — M47818 Spondylosis without myelopathy or radiculopathy, sacral and sacrococcygeal region: Secondary | ICD-10-CM | POA: Diagnosis not present

## 2018-01-10 DIAGNOSIS — M461 Sacroiliitis, not elsewhere classified: Secondary | ICD-10-CM | POA: Insufficient documentation

## 2018-01-10 NOTE — Patient Instructions (Signed)
Good to see you  Ice is yoru friend Stay active Hope the injection helps Write me in a week or so and if not better we may consider another epidural  Also we can consider piriformis injection here but will need a driver See em again in 4 weeks otherwise

## 2018-01-10 NOTE — Assessment & Plan Note (Signed)
Injection today.  Tolerated procedure well.  No improvement will need an epidural on the lumbar.  Discussed icing regimen and home exercises.  Discussed which activities to do which wants to avoid.  Follow-up again in 4 weeks

## 2018-03-12 ENCOUNTER — Other Ambulatory Visit: Payer: Self-pay | Admitting: Family Medicine

## 2018-03-12 NOTE — Telephone Encounter (Signed)
Refill done.  

## 2018-05-14 DIAGNOSIS — H02883 Meibomian gland dysfunction of right eye, unspecified eyelid: Secondary | ICD-10-CM | POA: Insufficient documentation

## 2018-05-29 ENCOUNTER — Other Ambulatory Visit: Payer: Self-pay | Admitting: Family Medicine

## 2018-09-12 ENCOUNTER — Ambulatory Visit (INDEPENDENT_AMBULATORY_CARE_PROVIDER_SITE_OTHER): Admitting: Family Medicine

## 2018-09-12 ENCOUNTER — Other Ambulatory Visit (INDEPENDENT_AMBULATORY_CARE_PROVIDER_SITE_OTHER)

## 2018-09-12 ENCOUNTER — Other Ambulatory Visit: Payer: Self-pay

## 2018-09-12 ENCOUNTER — Encounter: Payer: Self-pay | Admitting: Family Medicine

## 2018-09-12 VITALS — BP 118/80 | HR 56 | Ht 64.0 in | Wt 146.0 lb

## 2018-09-12 DIAGNOSIS — M2550 Pain in unspecified joint: Secondary | ICD-10-CM

## 2018-09-12 DIAGNOSIS — G8929 Other chronic pain: Secondary | ICD-10-CM

## 2018-09-12 DIAGNOSIS — M533 Sacrococcygeal disorders, not elsewhere classified: Secondary | ICD-10-CM | POA: Diagnosis not present

## 2018-09-12 DIAGNOSIS — M48062 Spinal stenosis, lumbar region with neurogenic claudication: Secondary | ICD-10-CM

## 2018-09-12 LAB — CBC WITH DIFFERENTIAL/PLATELET
Basophils Absolute: 0.1 10*3/uL (ref 0.0–0.1)
Basophils Relative: 1.4 % (ref 0.0–3.0)
Eosinophils Absolute: 0.1 10*3/uL (ref 0.0–0.7)
Eosinophils Relative: 3.5 % (ref 0.0–5.0)
HCT: 38.1 % (ref 36.0–46.0)
Hemoglobin: 13.3 g/dL (ref 12.0–15.0)
Lymphocytes Relative: 31.4 % (ref 12.0–46.0)
Lymphs Abs: 1.2 10*3/uL (ref 0.7–4.0)
MCHC: 35 g/dL (ref 30.0–36.0)
MCV: 90.8 fl (ref 78.0–100.0)
Monocytes Absolute: 0.3 10*3/uL (ref 0.1–1.0)
Monocytes Relative: 7 % (ref 3.0–12.0)
Neutro Abs: 2.2 10*3/uL (ref 1.4–7.7)
Neutrophils Relative %: 56.7 % (ref 43.0–77.0)
Platelets: 184 10*3/uL (ref 150.0–400.0)
RBC: 4.2 Mil/uL (ref 3.87–5.11)
RDW: 13.5 % (ref 11.5–15.5)
WBC: 3.9 10*3/uL — ABNORMAL LOW (ref 4.0–10.5)

## 2018-09-12 LAB — COMPREHENSIVE METABOLIC PANEL
ALT: 20 U/L (ref 0–35)
AST: 24 U/L (ref 0–37)
Albumin: 4.6 g/dL (ref 3.5–5.2)
Alkaline Phosphatase: 56 U/L (ref 39–117)
BUN: 14 mg/dL (ref 6–23)
CO2: 29 mEq/L (ref 19–32)
Calcium: 9.7 mg/dL (ref 8.4–10.5)
Chloride: 102 mEq/L (ref 96–112)
Creatinine, Ser: 0.79 mg/dL (ref 0.40–1.20)
GFR: 76.12 mL/min (ref >60.00)
Glucose, Bld: 87 mg/dL (ref 70–99)
Potassium: 4.3 mEq/L (ref 3.5–5.1)
Sodium: 139 mEq/L (ref 135–145)
Total Bilirubin: 0.4 mg/dL (ref 0.2–1.2)
Total Protein: 7.3 g/dL (ref 6.0–8.3)

## 2018-09-12 LAB — TSH: TSH: 2.49 u[IU]/mL (ref 0.35–4.50)

## 2018-09-12 LAB — FERRITIN: Ferritin: 12.9 ng/mL (ref 10.0–291.0)

## 2018-09-12 LAB — SEDIMENTATION RATE: Sed Rate: 3 mm/h (ref 0–30)

## 2018-09-12 NOTE — Progress Notes (Signed)
[facility] [address] [place], [place] [address] Phone: [phone] Subjective:   I Ronelle Nigh am serving as a Neurosurgeon for Dr. Antoine Primas.  I'm seeing this patient by the request  of:    CC: Back pain follow-up [id number]  Jasmine Zimmerman is a 53 y.o. female coming in with complaint of SI joint dysfunction. States she is feeling "defeated". Wants to know what else she can do.  We have been trying to treat patient's lumbar spinal stenosis conservatively for quite some time.  Patient has tried the home exercises, icing regimen, which activities of doing which wants to avoid.  Patient states that unfortunately seems like the pain is worsening at this time.  Feels the medications have not been beneficial.  Taking the gabapentin intermittently but does not feel it makes a significant difference.  Was taking the Effexor as well and same thing.  Patient continues with the once weekly vitamin D.  Patient is looking for more of a long-term solution at this point.     No past medical history on file. Past Surgical History:  Procedure Laterality Date  . APPENDECTOMY  1983  . CERVICAL FUSION  08/2007  . CESAREAN SECTION     Social History   Socioeconomic History  . Marital status: Married    Spouse name: Not on file  . Number of children: Not on file  . Years of education: Not on file  . Highest education level: Not on file  Occupational History  . Not on file  Social Needs  . Financial resource strain: Not on file  . Food insecurity:    Worry: Not on file    Inability: Not on file  . Transportation needs:    Medical: Not on file    Non-medical: Not on file  Tobacco Use  . Smoking status: Never Smoker  . Smokeless tobacco: Never Used  Substance and Sexual Activity  . Alcohol use: Not on file  . Drug use: Not on file  . Sexual activity: Not on file  Lifestyle  . Physical activity:    Days per week: Not on file    Minutes per session: Not on  file  . Stress: Not on file  Relationships  . Social connections:    Talks on phone: Not on file    Gets together: Not on file    Attends religious service: Not on file    Active member of club or organization: Not on file    Attends meetings of clubs or organizations: Not on file    Relationship status: Not on file  Other Topics Concern  . Not on file  Social History Narrative  . Not on file   No Known Allergies No family history on file.  Current Outpatient Medications (Endocrine & Metabolic):  .  dexamethasone (DECADRON) 4 MG/ML injection, To be used with Iontophoresis .  predniSONE (DELTASONE) 50 MG tablet, Take 1 tablet daily.      Current Outpatient Medications (Other):  .  gabapentin (NEURONTIN) 100 MG capsule, Take 2 capsules (200 mg total) by mouth at bedtime. Marland Kitchen  venlafaxine XR (EFFEXOR-XR) 37.5 MG 24 hr capsule, TAKE ONE CAPSULE BY MOUTH DAILY WITH BREAKFAST .  Vitamin D, Ergocalciferol, (DRISDOL) 50000 units CAPS capsule, Take 1 capsule (50,000 Units total) by mouth every 7 (seven) days.    Past medical history, social, surgical and family history all reviewed in electronic medical record.  No pertanent information unless stated regarding to  the chief complaint.   Review of Systems:  No headache, visual changes, nausea, vomiting, diarrhea, constipation, dizziness, abdominal pain, skin rash, fevers, chills, night sweats, weight loss, swollen lymph nodes, body aches, joint swelling, muscle aches, chest pain, shortness of breath, mood changes.   Objective  Blood pressure 118/80, pulse (!) 56, height 5\' 4"  (1.626 m), weight 146 lb (66.2 kg), SpO2 96 %. Systems examined below as of    General: No apparent distress alert and oriented x3 mood and affect normal, dressed appropriately.  HEENT: Pupils equal, extraocular movements intact  Respiratory: Patient's speak in full sentences and does not appear short of breath  Cardiovascular: No lower extremity edema, non  tender, no erythema  Skin: Warm dry intact with no signs of infection or rash on extremities or on axial skeleton.  Abdomen: Soft nontender  Neuro: Cranial nerves II through XII are intact, neurovascularly intact in all extremities with 2+ DTRs and 2+ pulses.  Lymph: No lymphadenopathy of posterior or anterior cervical chain or axillae bilaterally.  Gait normal with good balance and coordination.  MSK:  Non tender with full range of motion and good stability and symmetric strength and tone of shoulders, elbows, wrist, hip, knee and ankles bilaterally.  Back exam shows the patient does have some mild loss of lordosis.  Significant pain when patient goes from sitting to standing initially and then once fully erect seems to do a little bit better.  Patient does have full strength with deep tendon reflexes appear to be intact of the leg.  Patient does have more of a bogginess to her calves bilaterally which is different.  Denies any numbness.  Still tenderness to palpation over the right sacroiliac joint noted as well.  Positive Pearlean BrownieFaber with severe tightness of the right hip   Impression and Recommendations:     This case required medical decision making of moderate complexity. The above documentation has been reviewed and is accurate and complete Judi SaaZachary M Laurance Heide, DO       Note: This dictation was prepared with Dragon dictation along with smaller phrase technology. Any transcriptional errors that result from this process are unintentional.

## 2018-09-12 NOTE — Patient Instructions (Signed)
Good to see you  I am so sorry you are still hurting.  MRI of the back and sacrum  We will also get labs today  Let me see what these show and then if I have anything else up my sleeve or we will talk referral to neurosurgery

## 2018-09-12 NOTE — Assessment & Plan Note (Signed)
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  I am concerned that this is of worsening symptoms at the moment.  Discussed with patient in great length.  We discussed we could repeat the osteopathic manipulation but no significant improvement.  Patient has underlying Sjogren's that could also be contributing.  Laboratory work-up ordered today.  I feel repeating MRI would be beneficial as well as getting an MRI of the sacrum noted.  Patient did have some improvement with the sacroiliac injection that was given to her in September.  Depending on findings we will see and discussed different treatment options.

## 2018-09-15 LAB — CALCIUM, IONIZED: Calcium, Ion: 5.37 mg/dL (ref 4.8–5.6)

## 2018-09-15 LAB — VITAMIN D 1,25 DIHYDROXY
Vitamin D 1, 25 (OH)2 Total: 37 pg/mL (ref 18–72)
Vitamin D2 1, 25 (OH)2: <8 pg/mL
Vitamin D3 1, 25 (OH)2: 37 pg/mL

## 2018-09-15 LAB — ANA: Anti Nuclear Antibody (ANA): NEGATIVE

## 2018-09-27 ENCOUNTER — Other Ambulatory Visit: Payer: Self-pay

## 2018-09-27 ENCOUNTER — Ambulatory Visit
Admission: RE | Admit: 2018-09-27 | Discharge: 2018-09-27 | Disposition: A | Discharge status: Home / Self Care | Source: Ambulatory Visit | Attending: Family Medicine | Admitting: Family Medicine

## 2018-09-27 DIAGNOSIS — G8929 Other chronic pain: Secondary | ICD-10-CM

## 2018-09-27 DIAGNOSIS — M533 Sacrococcygeal disorders, not elsewhere classified: Secondary | ICD-10-CM

## 2018-10-11 ENCOUNTER — Encounter: Payer: Self-pay | Admitting: Family Medicine

## 2018-10-11 DIAGNOSIS — M999 Biomechanical lesion, unspecified: Secondary | ICD-10-CM

## 2018-10-22 ENCOUNTER — Other Ambulatory Visit: Payer: Self-pay | Admitting: Family Medicine

## 2018-11-01 ENCOUNTER — Ambulatory Visit
Admission: RE | Admit: 2018-11-01 | Discharge: 2018-11-01 | Disposition: A | Discharge status: Home / Self Care | Source: Ambulatory Visit | Attending: Family Medicine | Admitting: Family Medicine

## 2018-11-01 ENCOUNTER — Other Ambulatory Visit: Payer: Self-pay

## 2018-11-01 DIAGNOSIS — M999 Biomechanical lesion, unspecified: Secondary | ICD-10-CM

## 2018-11-01 MED ORDER — METHYLPREDNISOLONE ACETATE 40 MG/ML INJ SUSP (RADIOLOG
120.0000 mg | Freq: Once | INTRAMUSCULAR | Status: AC
  Administered 2018-11-01: 120 mg via EPIDURAL

## 2018-11-01 MED ORDER — IOPAMIDOL (ISOVUE-M 200) INJECTION 41%
1.0000 mL | Freq: Once | INTRAMUSCULAR | Status: AC
  Administered 2018-11-01: 1 mL via EPIDURAL

## 2018-11-01 NOTE — Discharge Instructions (Signed)

## 2019-05-13 ENCOUNTER — Other Ambulatory Visit: Payer: Self-pay | Admitting: Family Medicine

## 2019-05-22 ENCOUNTER — Telehealth: Payer: Self-pay | Admitting: Rheumatology

## 2019-05-22 NOTE — Telephone Encounter (Signed)
Patient called stating she was a patient of Dr. Fatima Sanger and was last seen on 01/26/16 [facility]).  Patient states she was diagnosed with Sjogrens.  Patient states she is not on any medications and hasn't seen another Rheumatologist.  Patient requested a return call to let her know if Dr. Corliss Skains recommends the current COVID vaccines or wait for the protein-based vaccines that should be coming out in the Spring.

## 2019-05-22 NOTE — Telephone Encounter (Signed)
Patient advised as she is not a current we can not give recommendations. Patient advised to contact PCP.

## 2019-05-27 ENCOUNTER — Other Ambulatory Visit: Payer: Self-pay | Admitting: Physician Assistant

## 2019-05-27 DIAGNOSIS — M48061 Spinal stenosis, lumbar region without neurogenic claudication: Secondary | ICD-10-CM

## 2019-05-29 ENCOUNTER — Ambulatory Visit
Admission: RE | Admit: 2019-05-29 | Discharge: 2019-05-29 | Disposition: A | Discharge status: Home / Self Care | Source: Ambulatory Visit | Attending: Physician Assistant | Admitting: Physician Assistant

## 2019-05-29 ENCOUNTER — Other Ambulatory Visit: Payer: Self-pay

## 2019-05-29 DIAGNOSIS — M48061 Spinal stenosis, lumbar region without neurogenic claudication: Secondary | ICD-10-CM

## 2019-05-29 MED ORDER — METHYLPREDNISOLONE ACETATE 40 MG/ML INJ SUSP (RADIOLOG
120.0000 mg | Freq: Once | INTRAMUSCULAR | Status: AC
  Administered 2019-05-29: 120 mg via EPIDURAL

## 2019-05-29 MED ORDER — IOPAMIDOL (ISOVUE-M 200) INJECTION 41%
1.0000 mL | Freq: Once | INTRAMUSCULAR | Status: AC
  Administered 2019-05-29: 1 mL via EPIDURAL

## 2019-05-29 NOTE — Discharge Instructions (Signed)

## 2019-10-23 ENCOUNTER — Other Ambulatory Visit: Payer: Self-pay | Admitting: Physician Assistant

## 2019-10-23 DIAGNOSIS — M48061 Spinal stenosis, lumbar region without neurogenic claudication: Secondary | ICD-10-CM

## 2019-10-23 DIAGNOSIS — M4802 Spinal stenosis, cervical region: Secondary | ICD-10-CM

## 2019-11-26 ENCOUNTER — Ambulatory Visit
Admission: RE | Admit: 2019-11-26 | Discharge: 2019-11-26 | Disposition: A | Discharge status: Home / Self Care | Source: Ambulatory Visit | Attending: Physician Assistant | Admitting: Physician Assistant

## 2019-11-26 ENCOUNTER — Other Ambulatory Visit: Payer: Self-pay

## 2019-11-26 DIAGNOSIS — M48061 Spinal stenosis, lumbar region without neurogenic claudication: Secondary | ICD-10-CM

## 2019-11-26 DIAGNOSIS — M4802 Spinal stenosis, cervical region: Secondary | ICD-10-CM

## 2020-02-10 ENCOUNTER — Other Ambulatory Visit: Payer: Self-pay | Admitting: Physician Assistant

## 2020-02-10 DIAGNOSIS — M48061 Spinal stenosis, lumbar region without neurogenic claudication: Secondary | ICD-10-CM

## 2020-02-21 ENCOUNTER — Ambulatory Visit
Admission: RE | Admit: 2020-02-21 | Discharge: 2020-02-21 | Disposition: A | Discharge status: Home / Self Care | Source: Ambulatory Visit | Attending: Physician Assistant | Admitting: Physician Assistant

## 2020-02-21 ENCOUNTER — Other Ambulatory Visit: Payer: Self-pay

## 2020-02-21 DIAGNOSIS — M48061 Spinal stenosis, lumbar region without neurogenic claudication: Secondary | ICD-10-CM

## 2020-02-21 MED ORDER — IOPAMIDOL (ISOVUE-M 200) INJECTION 41%
1.0000 mL | Freq: Once | INTRAMUSCULAR | Status: AC
  Administered 2020-02-21: 1 mL via EPIDURAL

## 2020-02-21 MED ORDER — METHYLPREDNISOLONE ACETATE 40 MG/ML INJ SUSP (RADIOLOG
120.0000 mg | Freq: Once | INTRAMUSCULAR | Status: AC
  Administered 2020-02-21: 120 mg via EPIDURAL

## 2020-02-21 NOTE — Discharge Instructions (Signed)

## 2020-02-24 ENCOUNTER — Other Ambulatory Visit: Payer: Self-pay | Admitting: Family Medicine

## 2020-02-25 ENCOUNTER — Other Ambulatory Visit: Payer: Self-pay

## 2020-02-25 MED ORDER — VENLAFAXINE HCL ER 37.5 MG PO CP24: ORAL_CAPSULE | ORAL | 0 refills | Status: AC

## 2020-02-25 MED ORDER — VENLAFAXINE HCL ER 37.5 MG PO CP24: 37.5000 mg | ORAL_CAPSULE | Freq: Every day | ORAL | 0 refills | Status: DC

## 2020-04-06 ENCOUNTER — Other Ambulatory Visit: Payer: Self-pay | Admitting: Family Medicine

## 2020-04-06 NOTE — Telephone Encounter (Signed)
Pateint called and is scheduled for Thursday at 415 for follow up

## 2020-04-06 NOTE — Telephone Encounter (Signed)
Left message for patient to call back. Needs to schedule virtual visit with Dr. Katrinka Blazing for medication refill.

## 2020-04-09 ENCOUNTER — Ambulatory Visit (INDEPENDENT_AMBULATORY_CARE_PROVIDER_SITE_OTHER): Admitting: Family Medicine

## 2020-04-09 ENCOUNTER — Encounter: Payer: Self-pay | Admitting: Family Medicine

## 2020-04-09 ENCOUNTER — Other Ambulatory Visit: Payer: Self-pay

## 2020-04-09 DIAGNOSIS — M48062 Spinal stenosis, lumbar region with neurogenic claudication: Secondary | ICD-10-CM

## 2020-04-09 MED ORDER — VITAMIN D (ERGOCALCIFEROL) 1.25 MG (50000 UNIT) PO CAPS: 50000.0000 [IU] | ORAL_CAPSULE | ORAL | 0 refills | Status: DC

## 2020-04-09 NOTE — Assessment & Plan Note (Signed)
Patient does have neurogenic claudication but is doing well.  Recently did get an epidural from another provider.  Did refill the Effexor at this time which has been helpful.  Patient also would like the vitamin D and we did refill that.  We discussed if worsening symptoms I should see her in office.  Patient also should have vitamin D checked and she will consider by primary care.  Follow-up with me again in 3 months

## 2020-04-09 NOTE — Progress Notes (Signed)
Virtual Visit via Video Note  I connected with Jasmine Zimmerman on 04/09/20 at  4:00 PM EST by a video enabled telemedicine application and verified that I am speaking with the correct person using two identifiers. Patient is alone Location: Patient: Patient is alone.  Difficulty with virtual setting.  Patient was leaving work Provider: In office setting   I discussed the limitations of evaluation and management by telemedicine and the availability of in person appointments. The patient expressed understanding and agreed to proceed.  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  Patient has responded very well to Effexor over the time.  Does think it does help somewhat.  Patient is also recently noted that she has been off vitamin D and may be having some more muscle aches and pains.  Patient has seen another provider for the back and recent epidural in October has been very beneficial.  Patient continues to stay fairly active.    Observations/Objective: Alert and oriented x3, very friendly.  Difficulty with the virtual platform so did mostly on the phone call   Assessment and Plan:Neurogenic claudication due to lumbar spinal stenosis Patient does have neurogenic claudication but is doing well.  Recently did get an epidural from another provider.  Did refill the Effexor at this time which has been helpful.  Patient also would like the vitamin D and we did refill that.  We discussed if worsening symptoms I should see her in office.  Patient also should have vitamin D checked and she will consider by primary care.  Follow-up with me again in 3 months     Follow Up Instructions: 3 months    I discussed the assessment and treatment plan with the patient. The patient was provided an opportunity to ask questions and all were answered. The patient agreed with the plan and demonstrated an understanding of the instructions.    The patient was advised to call back or seek an in-person evaluation if the symptoms worsen or if the condition fails to improve as anticipated.  I provided *16 minutes of time during this encounter.   Judi Saa, DO

## 2020-04-17 ENCOUNTER — Telehealth: Payer: Self-pay | Admitting: Nurse Practitioner

## 2020-04-17 DIAGNOSIS — U071 COVID-19: Secondary | ICD-10-CM

## 2020-04-17 NOTE — Telephone Encounter (Signed)
Called to discuss with Jasmine Zimmerman about Covid symptoms and the use of a monoclonal antibody infusion for those with mild to moderate Covid symptoms and at a high risk of hospitalization.    Pt is not qualified for this infusion due to lack of identified risk factors and co-morbid conditions. Will add patient to algorithm list as she may qualify based on SVI. She has sjogrens but not on immunosuppressive medications.  Symptoms reviewed as well as criteria for ending isolation.  Symptoms reviewed that would warrant ED/Hospital evaluation as well should condition worsen. Preventative practices reviewed. Patient verbalized understanding.  Patient Active Problem List   Diagnosis Date Noted  . 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 Neurogenic claudication due to lumbar spinal stenosis 06/20/2017  . Nonallopathic lesion of lumbosacral region 06/20/2017  . Nonallopathic lesion of sacral region 06/20/2017  . Nonallopathic lesion of thoracic region 06/20/2017   Consuello Masse, NP [phone] [email or web address]

## 2020-04-19 ENCOUNTER — Other Ambulatory Visit: Payer: Self-pay | Admitting: Physician Assistant

## 2020-04-19 DIAGNOSIS — U071 COVID-19: Secondary | ICD-10-CM

## 2020-04-19 NOTE — Progress Notes (Signed)
I connected by phone with Jasmine Zimmerman on 04/19/2020 at 9:26 AM to discuss the potential use of a new treatment for mild to moderate COVID-19 viral infection in non-hospitalized patients.  This patient is a 54 y.o. female that meets the FDA criteria for Emergency Use Authorization of COVID monoclonal antibody casirivimab/imdevimab, bamlanivimab/etesevimab, or sotrovimab.  Has a (+) direct SARS-CoV-2 viral test result  Has mild or moderate COVID-19   Is NOT hospitalized due to COVID-19  Is within 10 days of symptom onset  Has at least one of the high risk factor(s) for progression to severe COVID-19 and/or hospitalization as defined in EUA.  Specific high risk criteria : Other high risk medical condition per CDC:  SVI   I have spoken and communicated the following to the patient or parent/caregiver regarding COVID monoclonal antibody treatment:  1. FDA has authorized the emergency use for the treatment of mild to moderate COVID-19 in adults and pediatric patients with positive results of direct SARS-CoV-2 viral testing who are 42 years of age and older weighing at least 40 kg, and who are at high risk for progressing to severe COVID-19 and/or hospitalization.  2. The significant known and potential risks and benefits of COVID monoclonal antibody, and the extent to which such potential risks and benefits are unknown.  3. Information on available alternative treatments and the risks and benefits of those alternatives, including clinical trials.  4. Patients treated with COVID monoclonal antibody should continue to self-isolate and use infection control measures (e.g., wear mask, isolate, social distance, avoid sharing personal items, clean and disinfect "high touch" surfaces, and frequent handwashing) according to CDC guidelines.   5. The patient or parent/caregiver has the option to accept or refuse COVID monoclonal antibody treatment.  After reviewing this information with the patient, the  patient has agreed to receive one of the available covid 19 monoclonal antibodies and will be provided an appropriate fact sheet prior to infusion. Roe Rutherford Branton Einstein, [place] 04/19/2020 9:26 AM

## 2020-04-20 ENCOUNTER — Ambulatory Visit (HOSPITAL_COMMUNITY)
Admission: RE | Admit: 2020-04-20 | Discharge: 2020-04-20 | Disposition: A | Discharge status: Home / Self Care | Source: Ambulatory Visit | Attending: Pulmonary Disease | Admitting: Pulmonary Disease

## 2020-04-20 DIAGNOSIS — U071 COVID-19: Secondary | ICD-10-CM | POA: Diagnosis not present

## 2020-04-20 MED ORDER — SODIUM CHLORIDE 0.9 % IV SOLN: INTRAVENOUS | Status: DC | PRN

## 2020-04-20 MED ORDER — SODIUM CHLORIDE 0.9 % IV SOLN: Freq: Once | INTRAVENOUS | Status: AC

## 2020-04-20 MED ORDER — FAMOTIDINE IN NACL 20-0.9 MG/50ML-% IV SOLN: 20.0000 mg | Freq: Once | INTRAVENOUS | Status: DC | PRN

## 2020-04-20 MED ORDER — DIPHENHYDRAMINE HCL 50 MG/ML IJ SOLN: 50.0000 mg | Freq: Once | INTRAMUSCULAR | Status: DC | PRN

## 2020-04-20 MED ORDER — METHYLPREDNISOLONE SODIUM SUCC 125 MG IJ SOLR: 125.0000 mg | Freq: Once | INTRAMUSCULAR | Status: DC | PRN

## 2020-04-20 MED ORDER — EPINEPHRINE 0.3 MG/0.3ML IJ SOAJ: 0.3000 mg | Freq: Once | INTRAMUSCULAR | Status: DC | PRN

## 2020-04-20 MED ORDER — ALBUTEROL SULFATE HFA 108 (90 BASE) MCG/ACT IN AERS: 2.0000 | INHALATION_SPRAY | Freq: Once | RESPIRATORY_TRACT | Status: DC | PRN

## 2020-04-20 NOTE — Discharge Instructions (Signed)
10 Things You Can Do to Manage Your COVID-19 Symptoms at Home If you have possible or confirmed COVID-19: 1. Stay home from work and school. And stay away from other public places. If you must go out, avoid using any kind of public transportation, ridesharing, or taxis. 2. Monitor your symptoms carefully. If your symptoms get worse, call your healthcare provider immediately. 3. Get rest and stay hydrated. 4. If you have a medical appointment, call the healthcare provider ahead of time and tell them that you have or may have COVID-19. 5. For medical emergencies, call 911 and notify the dispatch personnel that you have or may have COVID-19. 6. Cover your cough and sneezes with a tissue or use the inside of your elbow. 7. Wash your hands often with soap and water for at least 20 seconds or clean your hands with an alcohol-based hand sanitizer that contains at least 60% alcohol. 8. As much as possible, stay in a specific room and away from other people in your home. Also, you should use a separate bathroom, if available. If you need to be around other people in or outside of the home, wear a mask. 9. Avoid sharing personal items with other people in your household, like dishes, towels, and bedding. 10. Clean all surfaces that are touched often, like counters, tabletops, and doorknobs. Use household cleaning sprays or wipes according to the label instructions. cdc.gov/coronavirus 10/31/2018 This information is not intended to replace advice given to you by your health care provider. Make sure you discuss any questions you have with your health care provider. Document Revised: 04/04/2019 Document Reviewed: 04/04/2019 Elsevier Patient Education  2020 Elsevier Inc. What types of side effects do monoclonal antibody drugs cause?  Common side effects  In general, the more common side effects caused by monoclonal antibody drugs include: . Allergic reactions, such as hives or itching . Flu-like signs and  symptoms, including chills, fatigue, fever, and muscle aches and pains . Nausea, vomiting . Diarrhea . Skin rashes . Low blood pressure   The CDC is recommending patients who receive monoclonal antibody treatments wait at least 90 days before being vaccinated.  Currently, there are no data on the safety and efficacy of mRNA COVID-19 vaccines in persons who received monoclonal antibodies or convalescent plasma as part of COVID-19 treatment. Based on the estimated half-life of such therapies as well as evidence suggesting that reinfection is uncommon in the 90 days after initial infection, vaccination should be deferred for at least 90 days, as a precautionary measure until additional information becomes available, to avoid interference of the antibody treatment with vaccine-induced immune responses. If you have any questions or concerns after the infusion please call the Advanced Practice Provider on call at [phone]. This number is ONLY intended for your use regarding questions or concerns about the infusion post-treatment side-effects.  Please do not provide this number to others for use. For return to work notes please contact your primary care provider.   If someone you know is interested in receiving treatment please have them call the COVID hotline at [phone].   

## 2020-04-20 NOTE — Progress Notes (Signed)
Patient reviewed Fact Sheet for Patients, Parents, and Caregivers for Emergency Use Authorization (EUA) of bamlanivimab and etesevimab for the Treatment of Coronavirus. Patient also reviewed and is agreeable to the estimated cost of treatment. Patient is agreeable to proceed.   

## 2020-04-20 NOTE — Progress Notes (Signed)
  Diagnosis: COVID-19  Physician: Dr. Wright   Procedure: [facility] Med: bamlanivimab\etesevimab infusion - Provided patient with bamlanimivab\etesevimab fact sheet for patients, parents and caregivers prior to infusion.  Complications: No immediate complications noted.  Discharge: Discharged home   Jasmine Zimmerman  [name] 04/20/2020   

## 2020-05-04 ENCOUNTER — Other Ambulatory Visit: Payer: Self-pay | Admitting: Family Medicine

## 2020-05-04 NOTE — Telephone Encounter (Signed)
Refill done.  

## 2020-07-15 NOTE — Progress Notes (Signed)
/ Jasmine Zimmerman [address] [place] [address] Phone: [phone] Subjective:   Jasmine Zimmerman, am serving as a scribe for Dr. Antoine Zimmerman. This visit occurred during the SARS-CoV-2 public health emergency.  Safety protocols were in place, including screening questions prior to the visit, additional usage of staff PPE, and extensive cleaning of exam room while observing appropriate contact time as indicated for disinfecting solutions.   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  Patient has responded very well to Effexor over the time.  Does think it does help somewhat.  Patient is also recently noted that she has been off vitamin D and may be having some more muscle aches and pains.  Patient has seen another provider for the back and recent epidural in October has been very beneficial.  Patient continues to stay fairly active.  Update 07/16/2020 Jasmine Zimmerman is a 55 y.o. female coming in with complaint of lumbar spinal stenosis. Epidural October 2021. Patient had COVID in December. Laid around a lot as she had a fever for 11 days. L>R. Pain in traps that radiates into the deltoids and on the left into the elbow. History of C5-7 fusion 2009. Using Aleve and stretching to alleviate pain. Pain with all motions.  Seems to be worse on the left than the right.   Regarding patient's back she is seen in the provider and having injections intermittently.  Considering the possibility of a laminectomy if necessary.      No past medical history on file. Past Surgical History:  Procedure Laterality Date  . APPENDECTOMY  1983  . CERVICAL FUSION  08/2007  . CESAREAN SECTION     Social History   Socioeconomic History  . Marital status: Married    Spouse name: Not on file   . Number of children: Not on file  . Years of education: Not on file  . Highest education level: Not on file  Occupational History  . Not on file  Tobacco Use  . Smoking status: Never Smoker  . Smokeless tobacco: Never Used  Substance and Sexual Activity  . Alcohol use: Not on file  . Drug use: Not on file  . Sexual activity: Not on file  Other Topics Concern  . Not on file  Social History Narrative  . Not on file   Social Determinants of Health   Financial Resource Strain: Not on file  Food Insecurity: Not on file  Transportation Needs: Not on file  Physical Activity: Not on file  Stress: Not on file  Social Connections: Not on file   No Known Allergies No family history on file.     Current Outpatient Medications (Analgesics):  .  meloxicam (MOBIC) 15 MG tablet, Take 1 tablet (15 mg total) by mouth daily.   Current Outpatient Medications (Other):  .  venlafaxine XR (EFFEXOR-XR) 37.5 MG 24 hr capsule, TAKE ONE CAPSULE BY MOUTH DAILY WITH BREAKFAST .  venlafaxine XR (EFFEXOR-XR) 37.5 MG 24 hr capsule, TAKE ONE CAPSULE BY MOUTH DAILY WITH BREAKFAST .  Vitamin D, Ergocalciferol, (DRISDOL) 1.25 MG (50000 UNIT) CAPS capsule, Take 1 capsule (50,000 Units total) by mouth every 7 (seven) days. .  Vitamin D, Ergocalciferol, (DRISDOL) 1.25 MG (50000 UNIT) CAPS capsule, Take 1 capsule (50,000 Units total) by mouth  every 7 (seven) days.   Reviewed prior external information including notes and imaging from  primary care provider As well as notes that were available from care everywhere and other healthcare systems.  Past medical history, social, surgical and family history all reviewed in electronic medical record.  No pertanent information unless stated regarding to the chief complaint.   Review of Systems:  No headache, visual changes, nausea, vomiting, diarrhea, constipation, dizziness, abdominal pain, skin rash, fevers, chills, night sweats, weight loss, swollen lymph  nodes, joint swelling, chest pain, shortness of breath, mood changes. POSITIVE muscle aches, body aches  Objective  Blood pressure 110/84, pulse 76, height 5\' 4"  (1.626 m), weight 145 lb (65.8 kg), SpO2 99 %.   General: No apparent distress alert and oriented x3 mood and affect normal, dressed appropriately.  HEENT: Pupils equal, extraocular movements intact  Respiratory: Patient's speak in full sentences and does not appear short of breath  Cardiovascular: No lower extremity edema, non tender, no erythema  Gait normal with good balance and coordination.  MSK: Shoulder: left Inspection reveals no abnormalities, atrophy or asymmetry. Palpation is normal with no tenderness over AC joint or bicipital groove. ROM is full in all planes passively. Rotator cuff strength normal throughout. signs of impingement with positive Neer and Hawkin's tests, but negative empty can sign. Speeds and Yergason's tests normal. No labral pathology noted with negative Obrien's, negative clunk and good stability. Normal scapular function observed. No painful arc and no drop arm sign. No apprehension sign  MSK performed of: left This study was ordered, performed, and interpreted by [place] D.O.  Shoulder:   Supraspinatus: Patient has some mild degenerative tearing noted but appears to be more chronic.  No significant retraction noted. Subscapularis:  Appears normal on long and transverse views. Positive bursa noted on impingement view AC joint: Mild lytic changes with mild distention of the capsule Glenohumeral Joint:  Appears normal without effusion. Biceps Tendon: Hypoechoic changes noted.  No acute tear appreciated  Impression: Subacromial bursitis      Impression and Recommendations:     The above documentation has been reviewed and is accurate and complete Terrilee Files, DO

## 2020-07-16 ENCOUNTER — Other Ambulatory Visit: Payer: Self-pay

## 2020-07-16 ENCOUNTER — Ambulatory Visit (INDEPENDENT_AMBULATORY_CARE_PROVIDER_SITE_OTHER): Admitting: Family Medicine

## 2020-07-16 ENCOUNTER — Ambulatory Visit (INDEPENDENT_AMBULATORY_CARE_PROVIDER_SITE_OTHER)

## 2020-07-16 ENCOUNTER — Encounter: Payer: Self-pay | Admitting: Family Medicine

## 2020-07-16 ENCOUNTER — Ambulatory Visit: Payer: Self-pay

## 2020-07-16 ENCOUNTER — Other Ambulatory Visit: Payer: Self-pay | Admitting: Family Medicine

## 2020-07-16 VITALS — BP 110/84 | HR 76 | Ht 64.0 in | Wt 145.0 lb

## 2020-07-16 DIAGNOSIS — M2550 Pain in unspecified joint: Secondary | ICD-10-CM | POA: Diagnosis not present

## 2020-07-16 DIAGNOSIS — M4722 Other spondylosis with radiculopathy, cervical region: Secondary | ICD-10-CM | POA: Insufficient documentation

## 2020-07-16 DIAGNOSIS — M25512 Pain in left shoulder: Secondary | ICD-10-CM

## 2020-07-16 DIAGNOSIS — M7552 Bursitis of left shoulder: Secondary | ICD-10-CM

## 2020-07-16 DIAGNOSIS — M5030 Other cervical disc degeneration, unspecified cervical region: Secondary | ICD-10-CM | POA: Diagnosis not present

## 2020-07-16 DIAGNOSIS — M25511 Pain in right shoulder: Secondary | ICD-10-CM

## 2020-07-16 LAB — COMPREHENSIVE METABOLIC PANEL
ALT: 21 U/L (ref 0–35)
AST: 25 U/L (ref 0–37)
Albumin: 4.4 g/dL (ref 3.5–5.2)
Alkaline Phosphatase: 63 U/L (ref 39–117)
BUN: 17 mg/dL (ref 6–23)
CO2: 31 mEq/L (ref 19–32)
Calcium: 9.8 mg/dL (ref 8.4–10.5)
Chloride: 103 mEq/L (ref 96–112)
Creatinine, Ser: 0.78 mg/dL (ref 0.40–1.20)
GFR: 85.83 mL/min (ref >60.00)
Glucose, Bld: 89 mg/dL (ref 70–99)
Potassium: 4.8 mEq/L (ref 3.5–5.1)
Sodium: 140 mEq/L (ref 135–145)
Total Bilirubin: 0.4 mg/dL (ref 0.2–1.2)
Total Protein: 7.1 g/dL (ref 6.0–8.3)

## 2020-07-16 LAB — CBC WITH DIFFERENTIAL/PLATELET
Basophils Absolute: 0.1 10*3/uL (ref 0.0–0.1)
Basophils Relative: 2 % (ref 0.0–3.0)
Eosinophils Absolute: 0.2 10*3/uL (ref 0.0–0.7)
Eosinophils Relative: 6.5 % — ABNORMAL HIGH (ref 0.0–5.0)
HCT: 39.6 % (ref 36.0–46.0)
Hemoglobin: 13.4 g/dL (ref 12.0–15.0)
Lymphocytes Relative: 31.4 % (ref 12.0–46.0)
Lymphs Abs: 1.2 10*3/uL (ref 0.7–4.0)
MCHC: 34 g/dL (ref 30.0–36.0)
MCV: 89.5 fl (ref 78.0–100.0)
Monocytes Absolute: 0.3 10*3/uL (ref 0.1–1.0)
Monocytes Relative: 7.5 % (ref 3.0–12.0)
Neutro Abs: 2 10*3/uL (ref 1.4–7.7)
Neutrophils Relative %: 52.6 % (ref 43.0–77.0)
Platelets: 184 10*3/uL (ref 150.0–400.0)
RBC: 4.42 Mil/uL (ref 3.87–5.11)
RDW: 14.1 % (ref 11.5–15.5)
WBC: 3.7 10*3/uL — ABNORMAL LOW (ref 4.0–10.5)

## 2020-07-16 LAB — URIC ACID: Uric Acid, Serum: 4.4 mg/dL (ref 2.4–7.0)

## 2020-07-16 LAB — VITAMIN D 25 HYDROXY (VIT D DEFICIENCY, FRACTURES): VITD: 45.79 ng/mL (ref 30.00–100.00)

## 2020-07-16 LAB — TSH: TSH: 1.7 u[IU]/mL (ref 0.35–4.50)

## 2020-07-16 MED ORDER — VITAMIN D (ERGOCALCIFEROL) 1.25 MG (50000 UNIT) PO CAPS: 50000.0000 [IU] | ORAL_CAPSULE | ORAL | 0 refills | Status: DC

## 2020-07-16 MED ORDER — VITAMIN D (ERGOCALCIFEROL) 1.25 MG (50000 UNIT) PO CAPS
50000.0000 [IU] | ORAL_CAPSULE | ORAL | 0 refills | Status: DC
Start: 2020-07-16 — End: 2021-10-02

## 2020-07-16 MED ORDER — MELOXICAM 15 MG PO TABS
15.0000 mg | ORAL_TABLET | Freq: Every day | ORAL | 0 refills | Status: DC
Start: 2020-07-16 — End: 2021-10-02

## 2020-07-16 NOTE — Assessment & Plan Note (Signed)
Patient does have signs and symptoms consistent with more of a shoulder bursitis.  The ultrasound does appear to have more calcific changes it does seem to be more of a chronic.  We discussed icing regimen and home exercises.  Continuing the Effexor.  Differential does include possible cervical radiculopathy and we will get x-rays.  Patient has had low vitamin D previously and has responded well to the once weekly vitamin D.  We will check vitamin D levels today.  Patient given exercises that I think will be beneficial.  Patient will come back in 4 to 6 weeks.  Continue to have pain consider formal physical therapy and injection.

## 2020-07-16 NOTE — Assessment & Plan Note (Signed)
Degenerative disc cervical.  Patient does have a history of cervical fusion.  We will continue to monitor.  Continue the Effexor.  Patient unable to tolerate the gabapentin

## 2020-07-16 NOTE — Patient Instructions (Addendum)
Good to see you Shoulder exercises Xray of the neck and left shoulder Meloxicam 15 mg daily for 10 days then as needed Do not use NSAIDS such as Advil or Aleve when taking Meloxicam It is ok to use Tylenol for additional pain relief Refilled Vitamin D Labs today See me again in 4-6 weeks

## 2020-08-26 NOTE — Progress Notes (Deleted)
[facility] [address] [place] [address] Phone: [phone] Subjective:    I'm seeing this patient by the request  of:  Patient, No Pcp Per (Inactive)  CC:   [id number]  Jasmine Zimmerman is a 55 y.o. female coming in with complaint of ***  Onset-  Location Duration-  Character- Aggravating factors- Reliving factors-  Therapies tried-  Severity-     No past medical history on file. Past Surgical History:  Procedure Laterality Date  . APPENDECTOMY  1983  . CERVICAL FUSION  08/2007  . CESAREAN SECTION     Social History   Socioeconomic History  . Marital status: Married    Spouse name: Not on file  . Number of children: Not on file  . Years of education: Not on file  . Highest education level: Not on file  Occupational History  . Not on file  Tobacco Use  . Smoking status: Never Smoker  . Smokeless tobacco: Never Used  Substance and Sexual Activity  . Alcohol use: Not on file  . Drug use: Not on file  . Sexual activity: Not on file  Other Topics Concern  . Not on file  Social History Narrative  . Not on file   Social Determinants of Health   Financial Resource Strain: Not on file  Food Insecurity: Not on file  Transportation Needs: Not on file  Physical Activity: Not on file  Stress: Not on file  Social Connections: Not on file   No Known Allergies No family history on file.     Current Outpatient Medications (Analgesics):  .  meloxicam (MOBIC) 15 MG tablet, Take 1 tablet (15 mg total) by mouth daily.   Current Outpatient Medications (Other):  .  venlafaxine XR (EFFEXOR-XR) 37.5 MG 24 hr capsule, TAKE ONE CAPSULE BY MOUTH DAILY WITH BREAKFAST .  venlafaxine XR (EFFEXOR-XR) 37.5 MG 24 hr capsule, TAKE ONE CAPSULE BY MOUTH DAILY WITH BREAKFAST .  Vitamin D, Ergocalciferol, (DRISDOL) 1.25 MG (50000 UNIT) CAPS capsule, Take 1 capsule (50,000 Units total) by mouth every 7 (seven) days. .  Vitamin D,  Ergocalciferol, (DRISDOL) 1.25 MG (50000 UNIT) CAPS capsule, Take 1 capsule (50,000 Units total) by mouth every 7 (seven) days.   Reviewed prior external information including notes and imaging from  primary care provider As well as notes that were available from care everywhere and other healthcare systems.  Past medical history, social, surgical and family history all reviewed in electronic medical record.  No pertanent information unless stated regarding to the chief complaint.   Review of Systems:  No headache, visual changes, nausea, vomiting, diarrhea, constipation, dizziness, abdominal pain, skin rash, fevers, chills, night sweats, weight loss, swollen lymph nodes, body aches, joint swelling, chest pain, shortness of breath, mood changes. POSITIVE muscle aches  Objective  There were no vitals taken for this visit.   General: No apparent distress alert and oriented x3 mood and affect normal, dressed appropriately.  HEENT: Pupils equal, extraocular movements intact  Respiratory: Patient's speak in full sentences and does not appear short of breath  Cardiovascular: No lower extremity edema, non tender, no erythema  Gait normal with good balance and coordination.  MSK:  Non tender with full range of motion and good stability and symmetric strength and tone of shoulders, elbows, wrist, hip, knee and ankles bilaterally.     Impression and Recommendations:     The above documentation has been reviewed and is accurate and complete Judi Saa,  DO

## 2020-08-27 ENCOUNTER — Ambulatory Visit: Admitting: Family Medicine

## 2020-09-01 ENCOUNTER — Other Ambulatory Visit: Payer: Self-pay | Admitting: Physician Assistant

## 2020-09-01 DIAGNOSIS — M48061 Spinal stenosis, lumbar region without neurogenic claudication: Secondary | ICD-10-CM

## 2020-09-01 DIAGNOSIS — M5416 Radiculopathy, lumbar region: Secondary | ICD-10-CM

## 2020-09-15 ENCOUNTER — Other Ambulatory Visit: Payer: Self-pay

## 2020-09-15 ENCOUNTER — Ambulatory Visit
Admission: RE | Admit: 2020-09-15 | Discharge: 2020-09-15 | Disposition: A | Discharge status: Home / Self Care | Source: Ambulatory Visit | Attending: Physician Assistant | Admitting: Physician Assistant

## 2020-09-15 DIAGNOSIS — M5416 Radiculopathy, lumbar region: Secondary | ICD-10-CM

## 2020-09-15 DIAGNOSIS — M48061 Spinal stenosis, lumbar region without neurogenic claudication: Secondary | ICD-10-CM

## 2020-09-15 MED ORDER — IOPAMIDOL (ISOVUE-M 200) INJECTION 41%
1.0000 mL | Freq: Once | INTRAMUSCULAR | Status: AC
  Administered 2020-09-15: 1 mL via EPIDURAL

## 2020-09-15 MED ORDER — METHYLPREDNISOLONE ACETATE 40 MG/ML INJ SUSP (RADIOLOG
80.0000 mg | Freq: Once | INTRAMUSCULAR | Status: AC
  Administered 2020-09-15: 80 mg via EPIDURAL

## 2020-09-15 NOTE — Discharge Instructions (Signed)

## 2020-09-30 ENCOUNTER — Ambulatory Visit: Admitting: Family Medicine

## 2020-10-19 ENCOUNTER — Other Ambulatory Visit: Payer: Self-pay | Admitting: Family Medicine

## 2020-12-10 ENCOUNTER — Ambulatory Visit: Admitting: Family Medicine

## 2021-01-28 ENCOUNTER — Other Ambulatory Visit: Payer: Self-pay | Admitting: Family Medicine

## 2021-02-03 ENCOUNTER — Other Ambulatory Visit: Payer: Self-pay | Admitting: Physician Assistant

## 2021-02-03 DIAGNOSIS — M48061 Spinal stenosis, lumbar region without neurogenic claudication: Secondary | ICD-10-CM

## 2021-02-03 DIAGNOSIS — M5412 Radiculopathy, cervical region: Secondary | ICD-10-CM

## 2021-02-03 DIAGNOSIS — M4802 Spinal stenosis, cervical region: Secondary | ICD-10-CM

## 2021-02-05 ENCOUNTER — Other Ambulatory Visit: Payer: Self-pay | Admitting: Physician Assistant

## 2021-02-05 DIAGNOSIS — M48061 Spinal stenosis, lumbar region without neurogenic claudication: Secondary | ICD-10-CM

## 2021-02-05 DIAGNOSIS — M5416 Radiculopathy, lumbar region: Secondary | ICD-10-CM

## 2021-02-11 ENCOUNTER — Ambulatory Visit
Admission: RE | Admit: 2021-02-11 | Discharge: 2021-02-11 | Disposition: A | Discharge status: Home / Self Care | Source: Ambulatory Visit | Attending: Physician Assistant | Admitting: Physician Assistant

## 2021-02-11 ENCOUNTER — Other Ambulatory Visit: Payer: Self-pay

## 2021-02-11 DIAGNOSIS — M48061 Spinal stenosis, lumbar region without neurogenic claudication: Secondary | ICD-10-CM

## 2021-02-11 MED ORDER — METHYLPREDNISOLONE ACETATE 40 MG/ML INJ SUSP (RADIOLOG
80.0000 mg | Freq: Once | INTRAMUSCULAR | Status: AC
  Administered 2021-02-11: 80 mg via EPIDURAL

## 2021-02-11 MED ORDER — IOPAMIDOL (ISOVUE-M 200) INJECTION 41%
1.0000 mL | Freq: Once | INTRAMUSCULAR | Status: AC
  Administered 2021-02-11: 1 mL via EPIDURAL

## 2021-02-11 NOTE — Discharge Instructions (Signed)

## 2021-02-21 ENCOUNTER — Ambulatory Visit
Admission: RE | Admit: 2021-02-21 | Discharge: 2021-02-21 | Disposition: A | Discharge status: Home / Self Care | Source: Ambulatory Visit | Attending: Physician Assistant | Admitting: Physician Assistant

## 2021-02-21 ENCOUNTER — Other Ambulatory Visit: Payer: Self-pay

## 2021-02-21 ENCOUNTER — Other Ambulatory Visit

## 2021-02-21 DIAGNOSIS — M5416 Radiculopathy, lumbar region: Secondary | ICD-10-CM

## 2021-02-21 DIAGNOSIS — M5412 Radiculopathy, cervical region: Secondary | ICD-10-CM

## 2021-02-21 DIAGNOSIS — M48061 Spinal stenosis, lumbar region without neurogenic claudication: Secondary | ICD-10-CM

## 2021-03-20 ENCOUNTER — Other Ambulatory Visit: Payer: Self-pay | Admitting: Physician Assistant

## 2021-03-20 DIAGNOSIS — M5416 Radiculopathy, lumbar region: Secondary | ICD-10-CM

## 2021-03-20 DIAGNOSIS — M48061 Spinal stenosis, lumbar region without neurogenic claudication: Secondary | ICD-10-CM

## 2021-04-06 NOTE — Progress Notes (Signed)
Jasmine Zimmerman [address] [place] [address] Phone: [phone] Subjective:   Jasmine Zimmerman, am serving as a scribe for Dr. Antoine Zimmerman.  This visit occurred during the SARS-CoV-2 public health emergency.  Safety protocols were in place, including screening questions prior to the visit, additional usage of staff PPE, and extensive cleaning of exam room while observing appropriate contact time as indicated for disinfecting solutions.   I'm seeing this patient by the request  of:  Patient, No Pcp Per (Inactive)  CC: Neck pain, arm pain  [id number]  07/16/2020 Patient does have signs and symptoms consistent with more of a shoulder bursitis.  The ultrasound does appear to have more calcific changes it does seem to be more of a chronic.  We discussed icing regimen and home exercises.  Continuing the Effexor.  Differential does include possible cervical radiculopathy and we will get x-rays.  Patient has had low vitamin D previously and has responded well to the once weekly vitamin D.  We will check vitamin D levels today.  Patient given exercises that I think will be beneficial.  Patient will come back in 4 to 6 weeks.  Continue to have pain consider formal physical therapy and injection.  Degenerative disc cervical.  Patient does have a history of cervical fusion.  We will continue to monitor.  Continue the Effexor.  Patient unable to tolerate the gabapentin  Updated 04/07/2021 Jasmine Zimmerman is a 55 y.o. female coming in with complaint of L shoulder pain. Patient states that L shoulder pain increased this week. Last night her pain was so bad that she almost went into ED. Pain mostly in posterior deltoid and scapula. On Monday her arm became numb and tingly. Patient using ice, heat, IBU and inversion table. Pain currently 6/10 that radiates down into fingertips. No decrease in strength. Driving to [employer]   Cervical spine MRI  02/21/2021 IMPRESSION: 1. Cervical spine degeneration especially affecting facets with multilevel anterolisthesis and active arthritis on the right at C4-5, usually symptomatic. 2. Foraminal stenosis at C3-4, C4-5, and on the left at C7-T1. 3. Solid arthrodesis at C5-C7.  Lumbar spine MRI 02/21/2021 IMPRESSION: Degeneration at L3-4 and below with compressive spinal stenosis at L4-5 and especially L3-4.       No past medical history on file. Past Surgical History:  Procedure Laterality Date   APPENDECTOMY  1983   CERVICAL FUSION  08/2007   CESAREAN SECTION     Social History   Socioeconomic History   Marital status: Married    Spouse name: Not on file   Number of children: Not on file   Years of education: Not on file   Highest education level: Not on file  Occupational History   Not on file  Tobacco Use   Smoking status: Never   Smokeless tobacco: Never  Substance and Sexual Activity   Alcohol use: Not on file   Drug use: Not on file   Sexual activity: Not on file  Other Topics Concern   Not on file  Social History Narrative   Not on file   Social Determinants of Health   Financial Resource Strain: Not on file  Food Insecurity: Not on file  Transportation Needs: Not on file  Physical Activity: Not on file  Stress: Not on file  Social Connections: Not on file   No Known Allergies No family history on file.  Current Outpatient Medications (Endocrine & Metabolic):    predniSONE (DELTASONE) 20  MG tablet, Take 2 tablets (40 mg total) by mouth daily with breakfast.    Current Outpatient Medications (Analgesics):    meloxicam (MOBIC) 15 MG tablet, Take 1 tablet (15 mg total) by mouth daily.   Current Outpatient Medications (Other):    gabapentin (NEURONTIN) 300 MG capsule, Take 1 capsule (300 mg total) by mouth at bedtime.   tiZANidine (ZANAFLEX) 2 MG tablet, Take 1 tablet (2 mg total) by mouth 2 (two) times daily.   venlafaxine XR (EFFEXOR-XR) 37.5 MG 24  hr capsule, TAKE ONE CAPSULE BY MOUTH DAILY WITH BREAKFAST   venlafaxine XR (EFFEXOR-XR) 37.5 MG 24 hr capsule, TAKE ONE CAPSULE BY MOUTH DAILY WITH BREAKFAST   Vitamin D, Ergocalciferol, (DRISDOL) 1.25 MG (50000 UNIT) CAPS capsule, Take 1 capsule (50,000 Units total) by mouth every 7 (seven) days.   Vitamin D, Ergocalciferol, (DRISDOL) 1.25 MG (50000 UNIT) CAPS capsule, Take 1 capsule (50,000 Units total) by mouth every 7 (seven) days.   Reviewed prior external information including notes and imaging from  primary care provider As well as notes that were available from care everywhere and other healthcare systems.  Past medical history, social, surgical and family history all reviewed in electronic medical record.  No pertanent information unless stated regarding to the chief complaint.   Review of Systems:  No headache, visual changes, nausea, vomiting, diarrhea, constipation, dizziness, abdominal pain, skin rash, fevers, chills, night sweats, weight loss, swollen lymph nodes, body aches, joint swelling, chest pain, shortness of breath, mood changes. POSITIVE muscle aches  Objective  Blood pressure 102/82, pulse 76, height 5\' 4"  (1.626 m), weight 147 lb (66.7 kg), SpO2 98 %.   General: No apparent distress alert and oriented x3 mood and affect normal, dressed appropriately.  HEENT: Pupils equal, extraocular movements intact  Respiratory: Patient's speak in full sentences and does not appear short of breath  Cardiovascular: No lower extremity edema, non tender, no erythema  Gait normal with good balance and coordination.  MSK: Neck exam shows the patient does have significant loss of lordosis.  Patient does have a positive Spurling's noted.  Seems to be mostly in the C6 and C7 distribution. Patient has very mild weakness mainly noted of the C6-7 and C8 distribution on the left   Impression and Recommendations:     The above documentation has been reviewed and is accurate and complete  , DO

## 2021-04-07 ENCOUNTER — Ambulatory Visit (INDEPENDENT_AMBULATORY_CARE_PROVIDER_SITE_OTHER): Admitting: Family Medicine

## 2021-04-07 ENCOUNTER — Ambulatory Visit: Payer: Self-pay

## 2021-04-07 ENCOUNTER — Other Ambulatory Visit: Payer: Self-pay

## 2021-04-07 ENCOUNTER — Encounter: Payer: Self-pay | Admitting: Family Medicine

## 2021-04-07 VITALS — BP 102/82 | HR 76 | Ht 64.0 in | Wt 147.0 lb

## 2021-04-07 DIAGNOSIS — M25512 Pain in left shoulder: Secondary | ICD-10-CM | POA: Diagnosis not present

## 2021-04-07 DIAGNOSIS — M2550 Pain in unspecified joint: Secondary | ICD-10-CM | POA: Diagnosis not present

## 2021-04-07 DIAGNOSIS — M5030 Other cervical disc degeneration, unspecified cervical region: Secondary | ICD-10-CM | POA: Diagnosis not present

## 2021-04-07 MED ORDER — METHYLPREDNISOLONE ACETATE 80 MG/ML IJ SUSP
80.0000 mg | Freq: Once | INTRAMUSCULAR | Status: AC
Start: 2021-04-07 — End: 2021-04-07
  Administered 2021-04-07: 80 mg via INTRAMUSCULAR

## 2021-04-07 MED ORDER — KETOROLAC TROMETHAMINE 60 MG/2ML IM SOLN
60.0000 mg | Freq: Once | INTRAMUSCULAR | Status: AC
  Administered 2021-04-07: 60 mg via INTRAMUSCULAR

## 2021-04-07 MED ORDER — GABAPENTIN 300 MG PO CAPS: 300.0000 mg | ORAL_CAPSULE | Freq: Every day | ORAL | 0 refills | Status: AC

## 2021-04-07 MED ORDER — PREDNISONE 20 MG PO TABS: 40.0000 mg | ORAL_TABLET | Freq: Every day | ORAL | 0 refills | Status: DC

## 2021-04-07 MED ORDER — TIZANIDINE HCL 2 MG PO TABS: 2.0000 mg | ORAL_TABLET | Freq: Two times a day (BID) | ORAL | 0 refills | Status: DC

## 2021-04-07 NOTE — Patient Instructions (Signed)
Injections in backside Pred 40mg  for 5 days Gabapentin 300mg  at night Zanaflex 2mg  at night BID 60 See me again in 4-5 weeks Send message after trip

## 2021-04-07 NOTE — Assessment & Plan Note (Signed)
History of the cervical radiculopathy.  Most recent MRI does show the patient does have some mild adjacent segment disease noted with foraminal stenosis left greater than right and patient is symptomatic more in the C7-T1 area.  Patient does not have any significant weakness which is good.  Patient given injections of the Toradol and Depo-Medrol today secondary to the severe pain.  Given a prescription for the Zanaflex as well as for prednisone.  Patient knows not to take an anti-inflammatory with taking the prednisone.  Patient can transition to the meloxicam if needed afterwards.  Continue the Effexor.  Follow-up with me again 2 to 3 weeks.

## 2021-04-12 ENCOUNTER — Encounter: Payer: Self-pay | Admitting: Sports Medicine

## 2021-04-12 ENCOUNTER — Encounter: Payer: Self-pay | Admitting: Neurology

## 2021-04-12 ENCOUNTER — Ambulatory Visit (INDEPENDENT_AMBULATORY_CARE_PROVIDER_SITE_OTHER): Admitting: Sports Medicine

## 2021-04-12 ENCOUNTER — Other Ambulatory Visit: Payer: Self-pay

## 2021-04-12 VITALS — BP 102/80 | HR 76 | Ht 64.0 in | Wt 147.0 lb

## 2021-04-12 DIAGNOSIS — R200 Anesthesia of skin: Secondary | ICD-10-CM

## 2021-04-12 DIAGNOSIS — M9901 Segmental and somatic dysfunction of cervical region: Secondary | ICD-10-CM

## 2021-04-12 DIAGNOSIS — R29898 Other symptoms and signs involving the musculoskeletal system: Secondary | ICD-10-CM | POA: Diagnosis not present

## 2021-04-12 DIAGNOSIS — M5030 Other cervical disc degeneration, unspecified cervical region: Secondary | ICD-10-CM | POA: Diagnosis not present

## 2021-04-12 DIAGNOSIS — M9900 Segmental and somatic dysfunction of head region: Secondary | ICD-10-CM

## 2021-04-12 DIAGNOSIS — Z981 Arthrodesis status: Secondary | ICD-10-CM | POA: Diagnosis not present

## 2021-04-12 DIAGNOSIS — M9902 Segmental and somatic dysfunction of thoracic region: Secondary | ICD-10-CM | POA: Diagnosis not present

## 2021-04-12 MED ORDER — MELOXICAM 15 MG PO TABS: 15.0000 mg | ORAL_TABLET | Freq: Every day | ORAL | 0 refills | Status: DC

## 2021-04-12 NOTE — Progress Notes (Signed)
Jasmine Zimmerman [facility] [address] [place] [address] Phone: [phone]   Assessment and Plan:     1. Degenerative disc disease, cervical 2. S/P cervical spinal fusion 3. Somatic dysfunction of cervical region 4. Somatic dysfunction of thoracic region 5. Somatic dysfunction of head region -Chronic with exacerbation, subsequent visit - Continued neck pain with radicular symptoms in the left upper extremity and new onset weakness in left hand over the past 1 week that has not been improving with Toradol/Depo-Medrol IM injection on 04/07/2021, prednisone course, Zanaflex - Patient has cervical spine MRI from 02/21/2021 showing foraminal stenosis at multiple levels including left at C7-T1 - Due to unresolved radicular symptoms with weakness in C8-T1 distribution today, will further evaluate with EMG - Once patient completes prednisone course on 04/13/2021, she can start on 04/14/2021 taking meloxicam 15 mg daily for 2 weeks - Patient elected to try OMT today.  Tolerated well per note below.  HVLA was not performed on cervical spine due to history of fusion C5-7 - Decision today to treat with OMT was based on Physical Exam  After verbal consent patient was treated with HVLA (high velocity low amplitude), ME (muscle energy), FPR (flex positional release), ST (soft tissue), techniques in cervical, head, thoracic  areas. Patient tolerated the procedure well with no improvement in symptoms.  Patient educated on potential side effects of soreness and recommended to rest, hydrate, and use Tylenol as needed for pain control.    Pertinent previous records reviewed include C-spine MRI 02/21/2021   Follow Up: After EMG to discuss results.  Could consider facet injection versus referral to neurosurgery   Subjective:    I, Jasmine Zimmerman, am serving as a Neurosurgeon for Jasmine Zimmerman  Chief Complaint: left shoulder pain   HPI:    07/16/2020 Patient does have signs and symptoms consistent with more of a shoulder bursitis.  The ultrasound does appear to have more calcific changes it does seem to be more of a chronic.  We discussed icing regimen and home exercises.  Continuing the Effexor.  Differential does include possible cervical radiculopathy and we will get x-rays.  Patient has had low vitamin D previously and has responded well to the once weekly vitamin D.  We will check vitamin D levels today.  Patient given exercises that I think will be beneficial.  Patient will come back in 4 to 6 weeks.  Continue to have pain consider formal physical therapy and injection.   Degenerative disc cervical.  Patient does have a history of cervical fusion.  We will continue to monitor.  Continue the Effexor.  Patient unable to tolerate the gabapentin   Updated 04/07/2021 Jasmine Zimmerman is a 55 y.o. female coming in with complaint of L shoulder pain. Patient states that L shoulder pain increased this week. Last night her pain was so bad that she almost went into ED. Pain mostly in posterior deltoid and scapula. On Monday her arm became numb and tingly. Patient using ice, heat, IBU and inversion table. Pain currently 6/10 that radiates down into fingertips. No decrease in strength. Driving to [employer]   04/12/21 Patient states she saw Jasmine Zimmerman on 12/7 for left arm pain and numbness/tingling. She said that it has continued and she is starting to worry about possible nerve damage. She has had something similar before and sees a neurologist at [facility].   Cervical spine MRI 02/21/2021 IMPRESSION: 1. Cervical spine degeneration especially affecting facets with multilevel anterolisthesis  and active arthritis on the right at C4-5, usually symptomatic. 2. Foraminal stenosis at C3-4, C4-5, and on the left at C7-T1. 3. Solid arthrodesis at C5-C7.   Lumbar spine MRI 02/21/2021 IMPRESSION: Degeneration at L3-4 and below with compressive spinal  stenosis at L4-5 and especially L3-4.  Additional pertinent review of systems negative.   Current Outpatient Medications:    gabapentin (NEURONTIN) 300 MG capsule, Take 1 capsule (300 mg total) by mouth at bedtime., Disp: 90 capsule, Rfl: 0   meloxicam (MOBIC) 15 MG tablet, Take 1 tablet (15 mg total) by mouth daily., Disp: 30 tablet, Rfl: 0   meloxicam (MOBIC) 15 MG tablet, Take 1 tablet (15 mg total) by mouth daily., Disp: 30 tablet, Rfl: 0   predniSONE (DELTASONE) 20 MG tablet, Take 2 tablets (40 mg total) by mouth daily with breakfast., Disp: 10 tablet, Rfl: 0   tiZANidine (ZANAFLEX) 2 MG tablet, Take 1 tablet (2 mg total) by mouth 2 (two) times daily., Disp: 60 tablet, Rfl: 0   venlafaxine XR (EFFEXOR-XR) 37.5 MG 24 hr capsule, TAKE ONE CAPSULE BY MOUTH DAILY WITH BREAKFAST, Disp: 30 capsule, Rfl: 0   venlafaxine XR (EFFEXOR-XR) 37.5 MG 24 hr capsule, TAKE ONE CAPSULE BY MOUTH DAILY WITH BREAKFAST, Disp: 90 capsule, Rfl: 0   Vitamin D, Ergocalciferol, (DRISDOL) 1.25 MG (50000 UNIT) CAPS capsule, Take 1 capsule (50,000 Units total) by mouth every 7 (seven) days., Disp: 12 capsule, Rfl: 0   Vitamin D, Ergocalciferol, (DRISDOL) 1.25 MG (50000 UNIT) CAPS capsule, Take 1 capsule (50,000 Units total) by mouth every 7 (seven) days., Disp: 12 capsule, Rfl: 0   Objective:     Vitals:   04/12/21 1012  BP: 102/80  Pulse: 76  SpO2: 94%  Weight: 147 lb (66.7 kg)  Height: 5\' 4"  (1.626 m)      Body mass index is 25.23 kg/m.    Physical Exam:    Cervical Spine: Posture normal Skin: normal, intact  Neurological:   Strength:  Right  Left   Deltoid 5/5 5/5  Bicep 5/5  5/5  Tricep 5/5 5/5  Wrist Flexion 5/5 5/5  Wrist Extension 5/5 5/5  Grip 5/5 4/5  Finger Abduction 5/5 4/5   Sensation: intact to light touch in upper extremities bilaterally  Spurling's: Positive on left, negative on right Neck ROM: Significantly limited rotation and sidebending with history of C5-7 fusion TTP:  Cervical paraspinal, trapezius NTTP:  cervical spinous processes, thoracic paraspinal   Electronically signed by:  D.[facility] 11:01 AM 04/12/21

## 2021-04-12 NOTE — Patient Instructions (Addendum)
Good to see you Starting Wednesday Dec 14th start meloxicam  Complete prednisone  Follow up after EMG to discuss results

## 2021-04-13 MED ORDER — CYCLOBENZAPRINE HCL 10 MG PO TABS: ORAL_TABLET | ORAL | 0 refills | Status: DC

## 2021-04-16 ENCOUNTER — Encounter: Payer: Self-pay | Admitting: Family Medicine

## 2021-04-20 ENCOUNTER — Ambulatory Visit (INDEPENDENT_AMBULATORY_CARE_PROVIDER_SITE_OTHER): Admitting: Neurology

## 2021-04-20 ENCOUNTER — Other Ambulatory Visit: Payer: Self-pay

## 2021-04-20 DIAGNOSIS — R200 Anesthesia of skin: Secondary | ICD-10-CM

## 2021-04-20 DIAGNOSIS — R29898 Other symptoms and signs involving the musculoskeletal system: Secondary | ICD-10-CM

## 2021-04-20 DIAGNOSIS — M5412 Radiculopathy, cervical region: Secondary | ICD-10-CM

## 2021-04-20 NOTE — Procedures (Signed)
[facility]  [address] [place], [address]  [place], [place] [address] Tel: [phone] Fax:  [phone] Test Date:  04/20/2021  Patient: Jasmine Zimmerman DOB: 1966-04-19 Physician: Nita Sickle, DO  Sex: Female Height: 5\' 4"  Ref Phys: , DO  ID#: [id number]   Technician:    Patient Complaints: This is a 55 year old female referred for evaluation of left hand paresthesias and weakness  NCV & EMG Findings: Extensive electrodiagnostic testing of the left upper extremity shows:  Left median, ulnar, and mixed palmar sensory responses are within normal limits. Left median motor responses within normal limits.  Left ulnar motor response shows reduced amplitude (5.4 mV).   Chronic motor axonal loss changes are seen affecting the C8 myotome on the left, without accompanied active denervation.   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 There is no evidence of a left ulnar neuropathy or carpal tunnel syndrome.   ___________________________ 53, DO    Nerve Conduction Studies Anti Sensory Summary Table   Stim Site NR Peak (ms) Norm Peak (ms) P-T Amp (V) Norm P-T Amp  Left Median Anti Sensory (2nd Digit)  33C  Wrist    3.0 <3.6 27.9 >15  Left Ulnar Anti Sensory (5th Digit)  33C  Wrist    2.6 <3.1 22.7 >10   Motor Summary Table   Stim Site NR Onset (ms) Norm Onset (ms) O-P Amp (mV) Norm O-P Amp Site1 Site2 Delta-0 (ms) Dist (cm) Vel (m/s) Norm Vel (m/s)  Left Median Motor (Abd Poll Brev)  33C  Wrist    3.4 <4.0 7.5 >6 Elbow Wrist 4.4 27.0 61 >50  Elbow    7.8  7.3         Left Ulnar Motor (Abd Dig Minimi)  33C  Wrist    2.3 <3.1 5.4 >7 B Elbow Wrist 3.6 20.0 56 >50  B Elbow    5.9  5.3  A Elbow B Elbow 1.8 10.0 56 >50  A Elbow    7.7  5.3          Comparison Summary Table   Stim Site NR Peak (ms) Norm Peak (ms) P-T Amp (V) Site1 Site2 Delta-P (ms) Norm Delta (ms)  Left Median/Ulnar Palm Comparison  (Wrist - 8cm)  33C  Median Palm    1.5 <2.2 66.7 Median Palm Ulnar Palm 0.0   Ulnar Palm    1.5 <2.2 15.9       EMG   Side Muscle Ins Act Fibs Psw Fasc Number Recrt Dur Dur. Amp Amp. Poly Poly. Comment  Left 1stDorInt Nml Nml Nml Nml 2- Rapid Many 1+ Many 1+ Many 1+ N/A  Left Biceps Nml Nml Nml Nml Nml Nml Nml Nml Nml Nml Nml Nml N/A  Left Deltoid Nml Nml Nml Nml Nml Nml Nml Nml Nml Nml Nml Nml N/A  Left Ext Indicis Nml Nml Nml Nml 1- Rapid Some 1+ Some 1+ Some 1+ N/A  Left PronatorTeres Nml Nml Nml Nml Nml Nml Nml Nml Nml Nml Nml Nml N/A  Left Triceps Nml Nml Nml Nml 1- Rapid Some 1+ Some 1+ Some 1+ N/A      Waveforms:

## 2021-04-23 ENCOUNTER — Other Ambulatory Visit: Payer: Self-pay | Admitting: Family Medicine

## 2021-04-28 ENCOUNTER — Other Ambulatory Visit: Payer: Self-pay | Admitting: Physician Assistant

## 2021-04-28 DIAGNOSIS — M4802 Spinal stenosis, cervical region: Secondary | ICD-10-CM

## 2021-05-02 HISTORY — PX: CERVICAL FUSION: SHX112

## 2021-05-07 ENCOUNTER — Ambulatory Visit
Admission: RE | Admit: 2021-05-07 | Discharge: 2021-05-07 | Disposition: A | Discharge status: Home / Self Care | Source: Ambulatory Visit | Attending: Physician Assistant | Admitting: Physician Assistant

## 2021-05-07 ENCOUNTER — Other Ambulatory Visit: Payer: Self-pay

## 2021-05-07 DIAGNOSIS — M4802 Spinal stenosis, cervical region: Secondary | ICD-10-CM

## 2021-05-21 ENCOUNTER — Ambulatory Visit
Admission: RE | Admit: 2021-05-21 | Discharge: 2021-05-21 | Disposition: A | Discharge status: Home / Self Care | Source: Ambulatory Visit | Attending: Physician Assistant | Admitting: Physician Assistant

## 2021-05-21 DIAGNOSIS — M48061 Spinal stenosis, lumbar region without neurogenic claudication: Secondary | ICD-10-CM

## 2021-05-21 MED ORDER — METHYLPREDNISOLONE ACETATE 40 MG/ML INJ SUSP (RADIOLOG
80.0000 mg | Freq: Once | INTRAMUSCULAR | Status: AC
  Administered 2021-05-21: 80 mg via EPIDURAL

## 2021-05-21 MED ORDER — IOPAMIDOL (ISOVUE-M 200) INJECTION 41%
1.0000 mL | Freq: Once | INTRAMUSCULAR | Status: AC
  Administered 2021-05-21: 1 mL via EPIDURAL

## 2021-05-21 NOTE — Discharge Instructions (Signed)

## 2021-05-31 ENCOUNTER — Other Ambulatory Visit: Payer: Self-pay | Admitting: Neurosurgery

## 2021-05-31 DIAGNOSIS — M9951 Intervertebral disc stenosis of neural canal of cervical region: Secondary | ICD-10-CM

## 2021-06-01 ENCOUNTER — Ambulatory Visit
Admission: RE | Admit: 2021-06-01 | Discharge: 2021-06-01 | Disposition: A | Discharge status: Home / Self Care | Source: Ambulatory Visit | Attending: Neurosurgery | Admitting: Neurosurgery

## 2021-06-01 DIAGNOSIS — M9951 Intervertebral disc stenosis of neural canal of cervical region: Secondary | ICD-10-CM

## 2021-06-08 DIAGNOSIS — M542 Cervicalgia: Secondary | ICD-10-CM | POA: Insufficient documentation

## 2021-08-04 ENCOUNTER — Other Ambulatory Visit: Payer: Self-pay | Admitting: Family Medicine

## 2021-09-29 ENCOUNTER — Other Ambulatory Visit: Payer: Self-pay | Admitting: Physician Assistant

## 2021-09-30 ENCOUNTER — Other Ambulatory Visit: Payer: Self-pay | Admitting: Physician Assistant

## 2021-09-30 DIAGNOSIS — M5136 Other intervertebral disc degeneration, lumbar region: Secondary | ICD-10-CM

## 2021-09-30 DIAGNOSIS — M48061 Spinal stenosis, lumbar region without neurogenic claudication: Secondary | ICD-10-CM

## 2021-10-01 ENCOUNTER — Other Ambulatory Visit: Payer: Self-pay

## 2021-10-01 ENCOUNTER — Emergency Department (HOSPITAL_BASED_OUTPATIENT_CLINIC_OR_DEPARTMENT_OTHER)
Admission: EM | Admit: 2021-10-01 | Discharge: 2021-10-02 | Disposition: A | Discharge status: Home / Self Care | Attending: Emergency Medicine | Admitting: Emergency Medicine

## 2021-10-01 DIAGNOSIS — M542 Cervicalgia: Secondary | ICD-10-CM | POA: Diagnosis not present

## 2021-10-01 DIAGNOSIS — R55 Syncope and collapse: Secondary | ICD-10-CM | POA: Diagnosis not present

## 2021-10-01 DIAGNOSIS — R200 Anesthesia of skin: Secondary | ICD-10-CM | POA: Diagnosis not present

## 2021-10-01 DIAGNOSIS — X58XXXA Exposure to other specified factors, initial encounter: Secondary | ICD-10-CM | POA: Insufficient documentation

## 2021-10-01 DIAGNOSIS — S0083XA Contusion of other part of head, initial encounter: Secondary | ICD-10-CM | POA: Diagnosis not present

## 2021-10-01 DIAGNOSIS — R1010 Upper abdominal pain, unspecified: Secondary | ICD-10-CM | POA: Diagnosis not present

## 2021-10-01 DIAGNOSIS — R040 Epistaxis: Secondary | ICD-10-CM | POA: Diagnosis not present

## 2021-10-01 DIAGNOSIS — S0990XA Unspecified injury of head, initial encounter: Secondary | ICD-10-CM | POA: Diagnosis present

## 2021-10-01 HISTORY — DX: Other intervertebral disc degeneration, lumbar region: M51.36

## 2021-10-01 HISTORY — DX: Other intervertebral disc degeneration, lumbar region without mention of lumbar back pain or lower extremity pain: M51.369

## 2021-10-01 NOTE — ED Triage Notes (Addendum)
Pt c/o LOC after having an episode of severe abdominal pain. Pt c/o head and neck pain, abrasions on her face, and right ankle pain. Pt also c/o bilateral hand numbness, tingling, and restless legs after LOC episode. Pt had a c-spine fusion from C4-T1 a few months ago, pt is concerned that she injured her neck. Soft C-collar in place.

## 2021-10-02 ENCOUNTER — Encounter (HOSPITAL_BASED_OUTPATIENT_CLINIC_OR_DEPARTMENT_OTHER): Payer: Self-pay | Admitting: Emergency Medicine

## 2021-10-02 ENCOUNTER — Emergency Department (HOSPITAL_BASED_OUTPATIENT_CLINIC_OR_DEPARTMENT_OTHER)

## 2021-10-02 ENCOUNTER — Other Ambulatory Visit: Payer: Self-pay

## 2021-10-02 LAB — MAGNESIUM: Magnesium: 2.1 mg/dL (ref 1.7–2.4)

## 2021-10-02 LAB — CBG MONITORING, ED: Glucose-Capillary: 105 mg/dL — ABNORMAL HIGH (ref 70–99)

## 2021-10-02 LAB — CBC WITH DIFFERENTIAL/PLATELET
Abs Immature Granulocytes: 0.02 10*3/uL (ref 0.00–0.07)
Basophils Absolute: 0.1 10*3/uL (ref 0.0–0.1)
Basophils Relative: 1 %
Eosinophils Absolute: 0.1 10*3/uL (ref 0.0–0.5)
Eosinophils Relative: 3 %
HCT: 40.3 % (ref 36.0–46.0)
Hemoglobin: 13.7 g/dL (ref 12.0–15.0)
Immature Granulocytes: 1 %
Lymphocytes Relative: 33 %
Lymphs Abs: 1.2 10*3/uL (ref 0.7–4.0)
MCH: 29.8 pg (ref 26.0–34.0)
MCHC: 34 g/dL (ref 30.0–36.0)
MCV: 87.6 fL (ref 80.0–100.0)
Monocytes Absolute: 0.3 10*3/uL (ref 0.1–1.0)
Monocytes Relative: 8 %
Neutro Abs: 2.1 10*3/uL (ref 1.7–7.7)
Neutrophils Relative %: 54 %
Platelets: 209 10*3/uL (ref 150–400)
RBC: 4.6 MIL/uL (ref 3.87–5.11)
RDW: 12.5 % (ref 11.5–15.5)
WBC: 3.7 10*3/uL — ABNORMAL LOW (ref 4.0–10.5)
nRBC: 0 % (ref 0.0–0.2)

## 2021-10-02 LAB — COMPREHENSIVE METABOLIC PANEL
ALT: 13 U/L (ref 0–44)
AST: 21 U/L (ref 15–41)
Albumin: 4.8 g/dL (ref 3.5–5.0)
Alkaline Phosphatase: 71 U/L (ref 38–126)
Anion gap: 12 (ref 5–15)
BUN: 16 mg/dL (ref 6–20)
CO2: 23 mmol/L (ref 22–32)
Calcium: 10.2 mg/dL (ref 8.9–10.3)
Chloride: 103 mmol/L (ref 98–111)
Creatinine, Ser: 0.92 mg/dL (ref 0.44–1.00)
GFR, Estimated: >60 mL/min (ref >60)
Glucose, Bld: 113 mg/dL — ABNORMAL HIGH (ref 70–99)
Potassium: 3.9 mmol/L (ref 3.5–5.1)
Sodium: 138 mmol/L (ref 135–145)
Total Bilirubin: 0.4 mg/dL (ref 0.3–1.2)
Total Protein: 7.5 g/dL (ref 6.5–8.1)

## 2021-10-02 LAB — TROPONIN I (HIGH SENSITIVITY)
Troponin I (High Sensitivity): 5 ng/L (ref <18)
Troponin I (High Sensitivity): 3 ng/L (ref <18)

## 2021-10-02 LAB — LACTIC ACID, PLASMA: Lactic Acid, Venous: 2 mmol/L (ref 0.5–1.9)

## 2021-10-02 LAB — CK: Total CK: 158 U/L (ref 38–234)

## 2021-10-02 MED ORDER — ONDANSETRON HCL 4 MG/2ML IJ SOLN
4.0000 mg | Freq: Once | INTRAMUSCULAR | Status: DC
  Filled 2021-10-02: qty 2

## 2021-10-02 MED ORDER — LORAZEPAM 2 MG/ML IJ SOLN
1.0000 mg | Freq: Once | INTRAMUSCULAR | Status: AC
  Administered 2021-10-02: 1 mg via INTRAVENOUS
  Filled 2021-10-02: qty 1

## 2021-10-02 MED ORDER — MORPHINE SULFATE (PF) 4 MG/ML IV SOLN
4.0000 mg | Freq: Once | INTRAVENOUS | Status: AC
  Administered 2021-10-02: 4 mg via INTRAVENOUS
  Filled 2021-10-02: qty 1

## 2021-10-02 NOTE — ED Notes (Signed)
Patient transported to CT via stretcher at this time -  calm and relaxed ; resting with eyes closed - arousable to verbal stimulation

## 2021-10-02 NOTE — ED Notes (Signed)
Returned to bedside at this time to review home med list and found pt to be tearful and restless in bed -- reports pain to neck is worsening and starting to have tingling to face.

## 2021-10-02 NOTE — ED Notes (Signed)
Late entry - Pt has since returned from [place] -- O2 desaturation on RA after IVP Morphine and IVP Ativan to 82% - pt now on 2.5L O2 via [place] with improvement to 96% -- Pt in  high-fowler's resting with eyes closed; RR even and unlabored on RA with symmetrical rise and fall of chest -- continuous pulse ox and cardiac monitoring maintained.

## 2021-10-02 NOTE — ED Notes (Signed)
ED Provider at bedside. 

## 2021-10-02 NOTE — Discharge Instructions (Signed)
Apply ice to areas that are hurting.  Ice to be applied for 30 minutes at a time, 4 times a day.  Take ibuprofen or naproxen as needed for pain.  To get additional pain relief, add acetaminophen.  Please be aware that if you combine acetaminophen with either ibuprofen or naproxen, you will get better pain relief than you get from taking either medication by itself.

## 2021-10-02 NOTE — ED Notes (Signed)
Date and time results received: 10/02/21 0146 (use smartphrase ".now" to insert current time)  Test: lactic acid Critical Value: 2.0  Name of Provider Notified: Tonette Lederer, MD  Orders Received? Or Actions Taken?:  n/a

## 2021-10-02 NOTE — ED Provider Notes (Signed)
[facility] EMERGENCY DEPT Provider Note   CSN: [id number] Arrival date & time: 10/01/21  2335     History  Chief Complaint  Patient presents with   Loss of Consciousness    Jasmine Zimmerman is a 56 y.o. female.  The history is provided by the patient.  Loss of Consciousness She has history of cervical fusion surgery on 06/24/2021, and comes in following a syncopal episode at home.  She had sudden onset of severe upper abdominal pain and thought that perhaps she needed to have a bowel movement.  She sat down on the commode, and apparently had a syncopal episode.  She woke up on the floor with her face down and brought in the air.  He is not certain how long she was unconscious for.  When she woke up, abdominal pain had resolved.  She did go back on the commode and had a bowel movement.  However, she was feeling lightheaded at this point and her husband helped her back to bed.  She did suffer some abrasions to her nose and forehead and did have a nosebleed.  She also is feeling shaky and is having some involuntary twitching.  She is having some numbness in her hands and feet.  She also is complaining of pain on the inside in her neck and she is worried that she may have done some damage to her cervical fusion hardware.  She denies chest pain, heaviness, tightness, pressure.  She denies palpitations.  She denies nausea or vomiting and she denies diaphoresis.   Home Medications Prior to Admission medications   Medication Sig Start Date End Date Taking? Authorizing Provider  cyclobenzaprine (FLEXERIL) 10 MG tablet One half tab PO at bedtime, then increase gradually to one tab three times a day as needed for muscle spasms and pain 04/13/21   Richardean Sale, DO  gabapentin (NEURONTIN) 300 MG capsule Take 1 capsule (300 mg total) by mouth at bedtime. 04/07/21   Judi Saa, DO  meloxicam (MOBIC) 15 MG tablet Take 1 tablet (15 mg total) by mouth daily. 07/16/20   Judi Saa, DO   meloxicam (MOBIC) 15 MG tablet Take 1 tablet (15 mg total) by mouth daily. 04/12/21   Richardean Sale, DO  predniSONE (DELTASONE) 20 MG tablet Take 2 tablets (40 mg total) by mouth daily with breakfast. 04/07/21   Judi Saa, DO  tiZANidine (ZANAFLEX) 2 MG tablet Take 1 tablet (2 mg total) by mouth 2 (two) times daily. 04/07/21   Judi Saa, DO  venlafaxine XR (EFFEXOR-XR) 37.5 MG 24 hr capsule TAKE ONE CAPSULE BY MOUTH DAILY WITH BREAKFAST 02/25/20   Kendrick Fries, PA  venlafaxine XR (EFFEXOR-XR) 37.5 MG 24 hr capsule TAKE ONE CAPSULE BY MOUTH DAILY WITH BREAKFAST 08/04/21   Judi Saa, DO  Vitamin D, Ergocalciferol, (DRISDOL) 1.25 MG (50000 UNIT) CAPS capsule Take 1 capsule (50,000 Units total) by mouth every 7 (seven) days. 07/16/20   Judi Saa, DO  Vitamin D, Ergocalciferol, (DRISDOL) 1.25 MG (50000 UNIT) CAPS capsule Take 1 capsule (50,000 Units total) by mouth every 7 (seven) days. 07/16/20   Judi Saa, DO      Allergies    Patient has no known allergies.    Review of Systems   Review of Systems  Cardiovascular:  Positive for syncope.  All other systems reviewed and are negative.  Physical Exam Updated Vital Signs BP 117/71   Pulse 69   Resp 18   Ht 5'  4" (1.626 m)   Wt 64 kg   SpO2 99%   BMI 24.20 kg/m  Physical Exam Vitals and nursing note reviewed.  56 year old female, resting comfortably and in no acute distress. Vital signs are normal. Oxygen saturation is 99%, which is normal.  She is very anxious and does appear to be hyperventilating during my exam even though respiratory rate is reported to be normal. Head is normocephalic.  Minimal superficial abrasions are noted on the left side of the forehead and the left side of the bridge of the nose.  Dried blood is noted on the right side of the nasal septum without any active bleeding, septum is in the midline and without hematoma.  There is no nasal swelling or deformity. PERRLA, EOMI.  Oropharynx is clear. Neck is nontender.  Anterior cervical scars are well-healed. Back is nontender and there is no CVA tenderness. Lungs are clear without rales, wheezes, or rhonchi. Chest is nontender. Heart has regular rate and rhythm without murmur. Abdomen is soft, flat, nontender. Extremities have no cyanosis or edema, full range of motion is present. Skin is warm and dry without rash. Neurologic: Mental status is normal, cranial nerves are intact.  Strength is 5/5 in all 4 extremities.  Sensation is normal in all 4 extremities.  Intermittent twitching is noted but no baseline tremulousness.  ED Results / Procedures / Treatments   Labs (all labs ordered are listed, but only abnormal results are displayed) Labs Reviewed  LACTIC ACID, PLASMA - Abnormal; Notable for the following components:      Result Value   Lactic Acid, Venous 2.0 (*)    All other components within normal limits  COMPREHENSIVE METABOLIC PANEL - Abnormal; Notable for the following components:   Glucose, Bld 113 (*)    All other components within normal limits  CBC WITH DIFFERENTIAL/PLATELET - Abnormal; Notable for the following components:   WBC 3.7 (*)    All other components within normal limits  CBG MONITORING, ED - Abnormal; Notable for the following components:   Glucose-Capillary 105 (*)    All other components within normal limits  CK  MAGNESIUM  TROPONIN I (HIGH SENSITIVITY)  TROPONIN I (HIGH SENSITIVITY)    EKG ED ECG REPORT   Date: 10/02/2021  Rate: 75  Rhythm: normal sinus rhythm  QRS Axis: normal  Intervals: normal  ST/T Wave abnormalities: normal  Conduction Disutrbances:none  Narrative Interpretation: Normal ECG.  No prior ECG available for comparison.  Old EKG Reviewed: none available  I have personally reviewed the EKG tracing and agree with the computerized printout as noted.  Radiology CT Head Wo Contrast  Result Date: 10/02/2021 CLINICAL DATA:  Loss of consciousness and  subsequent fall. EXAM: CT HEAD WITHOUT CONTRAST TECHNIQUE: Contiguous axial images were obtained from the base of the skull through the vertex without intravenous contrast. RADIATION DOSE REDUCTION: This exam was performed according to the departmental dose-optimization program which includes automated exposure control, adjustment of the mA and/or kV according to patient size and/or use of iterative reconstruction technique. COMPARISON:  None Available. FINDINGS: Brain: No evidence of acute infarction, hemorrhage, hydrocephalus, extra-axial collection or mass lesion/mass effect. Vascular: No hyperdense vessel or unexpected calcification. Skull: Normal. Negative for fracture or focal lesion. Sinuses/Orbits: A chronic deformity is seen involving the medial wall of the right maxillary sinus. Other: None. IMPRESSION: No acute intracranial abnormality. Electronically Signed   By: Virgina Norfolk M.D.   On: 10/02/2021 01:42   CT Cervical Spine Wo Contrast  Result Date: 10/02/2021 CLINICAL DATA:  Loss of consciousness and subsequent fall. EXAM: CT CERVICAL SPINE WITHOUT CONTRAST TECHNIQUE: Multidetector CT imaging of the cervical spine was performed without intravenous contrast. Multiplanar CT image reconstructions were also generated. RADIATION DOSE REDUCTION: This exam was performed according to the departmental dose-optimization program which includes automated exposure control, adjustment of the mA and/or kV according to patient size and/or use of iterative reconstruction technique. COMPARISON:  June 01, 2021 FINDINGS: Alignment: Normal. Skull base and vertebrae: No acute fracture. Metallic density fusion plates and screws are seen along the anterior aspects of C4-C5 and C7-T1. Soft tissues and spinal canal: No prevertebral fluid or swelling. No visible canal hematoma. Disc levels: Prior cervical fusion is seen at the levels of C5-C6 and C6-C7, with interval cervical fusion of the C4-C5 and C7-T1 levels since  the prior study. There is marked severity narrowing of the anterior atlantoaxial articulation. Stable areas of bilateral neural foraminal narrowing is seen. Bilateral moderate to marked severity multilevel facet joint hypertrophy is noted. Upper chest: Negative. Other: None. IMPRESSION: 1. Prior cervical fusion at the levels of C5-C6 and C6-C7, with interval cervical fusion of the C4-C5 and C7-T1 levels since the prior study. 2. No acute cervical spine fracture. Electronically Signed   By: Virgina Norfolk M.D.   On: 10/02/2021 01:54   CT Maxillofacial Wo Contrast  Result Date: 10/02/2021 CLINICAL DATA:  Loss of consciousness and subsequent fall. EXAM: CT MAXILLOFACIAL WITHOUT CONTRAST TECHNIQUE: Multidetector CT imaging of the maxillofacial structures was performed. Multiplanar CT image reconstructions were also generated. RADIATION DOSE REDUCTION: This exam was performed according to the departmental dose-optimization program which includes automated exposure control, adjustment of the mA and/or kV according to patient size and/or use of iterative reconstruction technique. COMPARISON:  None Available. FINDINGS: Osseous: No fracture or mandibular dislocation. No destructive process. Orbits: Negative. No traumatic or inflammatory finding. Sinuses: A chronic appearing deformity is seen involving the medial wall of the right maxillary sinus. Soft tissues: Negative. Limited intracranial: No significant or unexpected finding. Other: Postoperative changes are seen within the visualized portion of the cervical spine. IMPRESSION: 1. No acute fracture or mandibular dislocation. 2. Chronic appearing deformity involving the medial wall of the right maxillary sinus. Electronically Signed   By: Virgina Norfolk M.D.   On: 10/02/2021 01:44    Procedures Procedures  Cardiac monitor shows normal sinus rhythm, per my interpretation.  Medications Ordered in ED Medications  ondansetron (ZOFRAN) injection 4 mg (0 mg  Intravenous Hold 10/02/21 0123)  LORazepam (ATIVAN) injection 1 mg (1 mg Intravenous Given 10/02/21 0055)  morphine (PF) 4 MG/ML injection 4 mg (4 mg Intravenous Given 10/02/21 0055)    ED Course/ Medical Decision Making/ A&P                           Medical Decision Making Amount and/or Complexity of Data Reviewed Labs: ordered. Radiology: ordered.  Risk Prescription drug management.   Apparent syncopal episode.  Syncope during episode of intense pain would be consistent with vasovagal episode, but ancillary symptoms of vasovagal syncope were not present.  Consider cardiac arrhythmia, transient hypotension.  Also, consider seizure with tremulousness noted on exam.  Old records are reviewed confirming cervical spine fusion surgery on 06/24/2021 at [facility] spine center.  ECG is obtained, and I have personally viewed and interpreted it, and it shows no acute changes.  Labs are ordered including CBC, comprehensive metabolic panel, CK, lactic acid level,  magnesium.  She is given a dose of morphine for pain.  She is also given a dose of lorazepam for anxiety.  CT scans showed no evidence of acute injury.  I have independently viewed all of the images, and agree with the radiologist's interpretation.  I have reviewed and interpreted all of the laboratory tests.  Lactic acid is minimally elevated, felt to be secondary to hypotension during episode of syncope and not related to sepsis.  CK is normal making seizure very unlikely.  Metabolic panel was significant only for minimal elevation of glucose.  Troponin is normal x2, no evidence of cardiac injury.  Patient initially had drop in her oxygen saturation following morphine and lorazepam, but this improved with simple observation.  I have gone back to reevaluate the patient, and she is no longer hyperventilating and no longer jittery.  She states she feels much better.  She is discharged with instructions to apply ice to sore areas, use over-the-counter  NSAIDs and acetaminophen as needed for pain.  Final Clinical Impression(s) / ED Diagnoses Final diagnoses:  Syncope, unspecified syncope type  Contusion of forehead, initial encounter  Epistaxis due to trauma    Rx / DC Orders ED Discharge Orders     None         Delora Fuel, [place] [id number] [phone]

## 2021-10-02 NOTE — ED Notes (Addendum)
Pt remains awake and alert however anxious appearing with intermittent jerking of BLE which pt states is not normal for her-- pt states syncopal episode is possible result of vasovagal response when on the commode at home pta-- pt states when she spontaneously aroused she was face down on the tile floor with buttock in the air in the bathroom  --- pt unable to confirm but states possible downtime 5-72minutes. Soft c-collar in place; recent spinal fusion (C4-T1).  Small abrasion noted to L forehead with dried blood to tip of nose; pt states nosebleed prior to arrival - also small cuts to BUE digits; no active bleeding.

## 2021-10-12 ENCOUNTER — Ambulatory Visit
Admission: RE | Admit: 2021-10-12 | Discharge: 2021-10-12 | Disposition: A | Discharge status: Home / Self Care | Source: Ambulatory Visit | Attending: Physician Assistant | Admitting: Physician Assistant

## 2021-10-12 DIAGNOSIS — M48061 Spinal stenosis, lumbar region without neurogenic claudication: Secondary | ICD-10-CM

## 2021-10-12 MED ORDER — METHYLPREDNISOLONE ACETATE 40 MG/ML INJ SUSP (RADIOLOG
80.0000 mg | Freq: Once | INTRAMUSCULAR | Status: AC
  Administered 2021-10-12: 80 mg via EPIDURAL

## 2021-10-12 MED ORDER — IOPAMIDOL (ISOVUE-M 200) INJECTION 41%
1.0000 mL | Freq: Once | INTRAMUSCULAR | Status: AC
  Administered 2021-10-12: 1 mL via EPIDURAL

## 2021-10-12 NOTE — Discharge Instructions (Signed)

## 2021-10-14 ENCOUNTER — Other Ambulatory Visit: Payer: Self-pay | Admitting: Family Medicine

## 2021-10-19 ENCOUNTER — Other Ambulatory Visit: Payer: Self-pay | Admitting: Family Medicine

## 2022-01-27 ENCOUNTER — Other Ambulatory Visit: Payer: Self-pay | Admitting: Physician Assistant

## 2022-01-27 DIAGNOSIS — M48061 Spinal stenosis, lumbar region without neurogenic claudication: Secondary | ICD-10-CM

## 2022-02-04 DIAGNOSIS — M4312 Spondylolisthesis, cervical region: Secondary | ICD-10-CM | POA: Insufficient documentation

## 2022-02-04 DIAGNOSIS — M48061 Spinal stenosis, lumbar region without neurogenic claudication: Secondary | ICD-10-CM | POA: Insufficient documentation

## 2022-02-11 ENCOUNTER — Ambulatory Visit
Admission: RE | Admit: 2022-02-11 | Discharge: 2022-02-11 | Disposition: A | Discharge status: Home / Self Care | Source: Ambulatory Visit | Attending: Physician Assistant | Admitting: Physician Assistant

## 2022-02-11 DIAGNOSIS — M48061 Spinal stenosis, lumbar region without neurogenic claudication: Secondary | ICD-10-CM

## 2022-02-11 MED ORDER — METHYLPREDNISOLONE ACETATE 40 MG/ML INJ SUSP (RADIOLOG
80.0000 mg | Freq: Once | INTRAMUSCULAR | Status: AC
  Administered 2022-02-11: 80 mg via EPIDURAL

## 2022-02-11 MED ORDER — IOPAMIDOL (ISOVUE-M 200) INJECTION 41%
1.0000 mL | Freq: Once | INTRAMUSCULAR | Status: AC
  Administered 2022-02-11: 1 mL via EPIDURAL

## 2022-02-11 NOTE — Discharge Instructions (Signed)

## 2022-03-11 IMAGING — DX DG CERVICAL SPINE COMPLETE 4+V
5 series · 5 of 5 positions shown · non-contrast
Comparison: MRI 11/26/2019.  Plain film 06/03/2016

CLINICAL DATA: Shoulder pain.  Cervical fusion 10 years ago.

EXAM:
CERVICAL SPINE - COMPLETE 4+ VIEW

[c-spine lat]
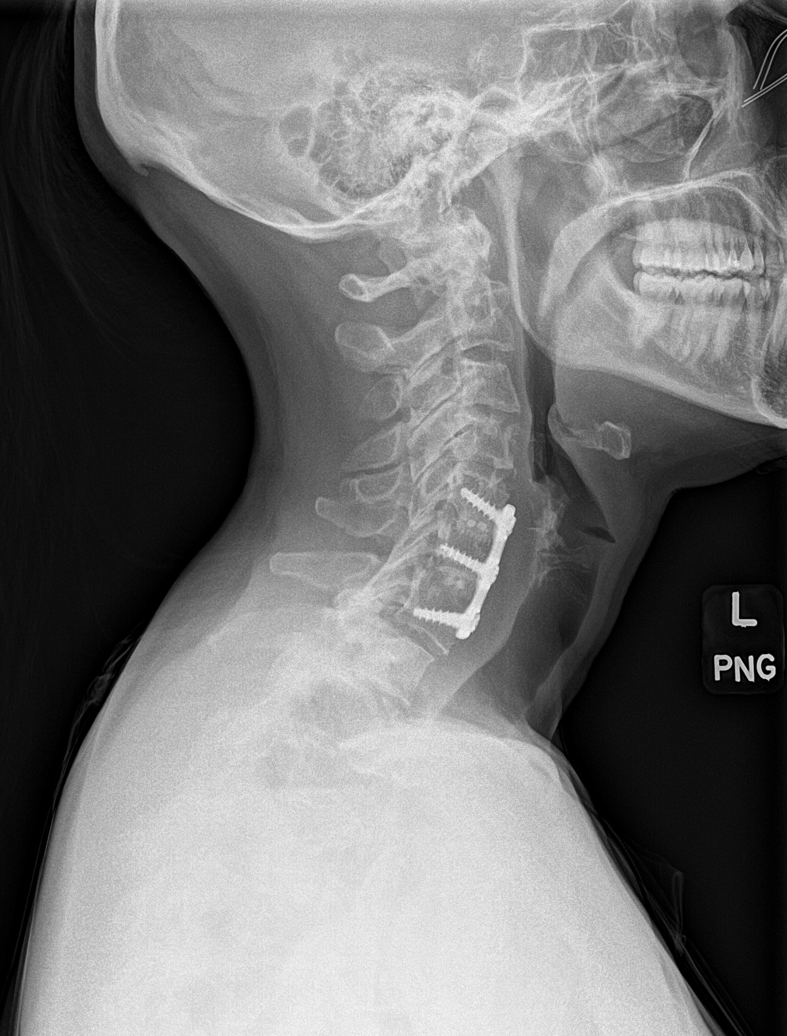

[c-spine obl (1 of 2)]
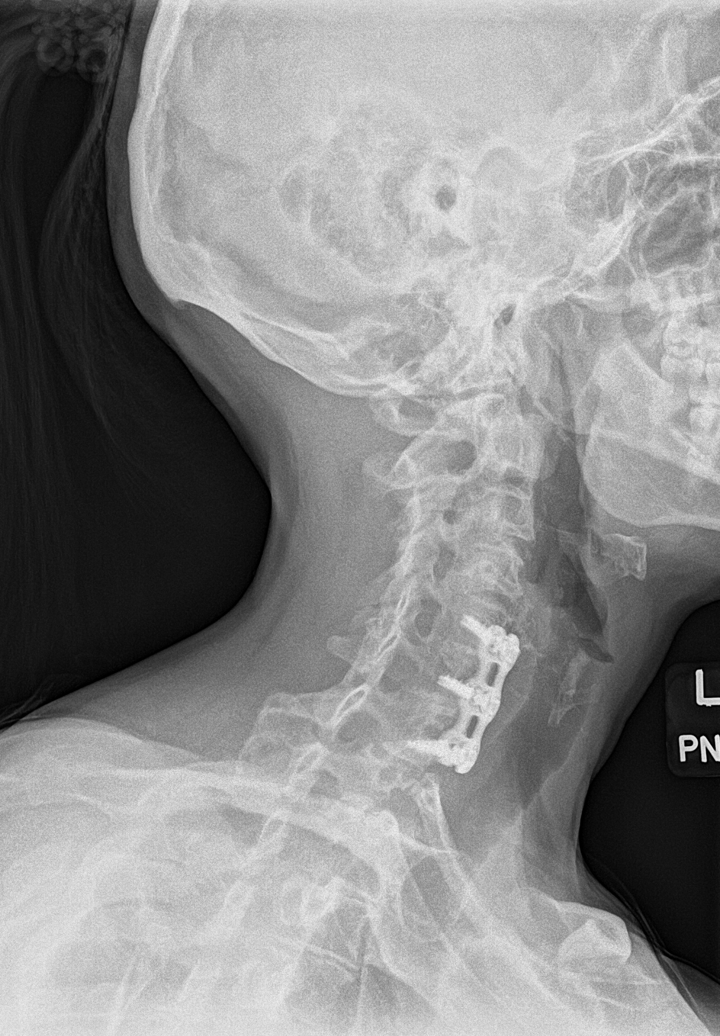

[c-spine obl (2 of 2)]
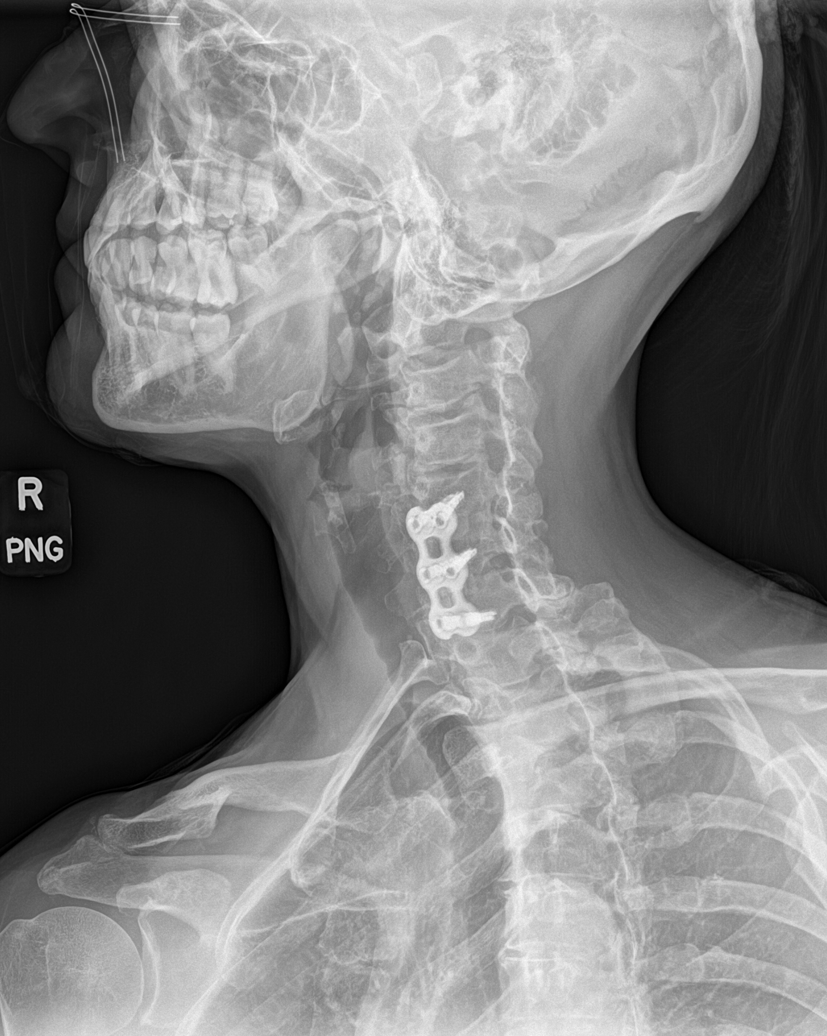

[c-spine ap]
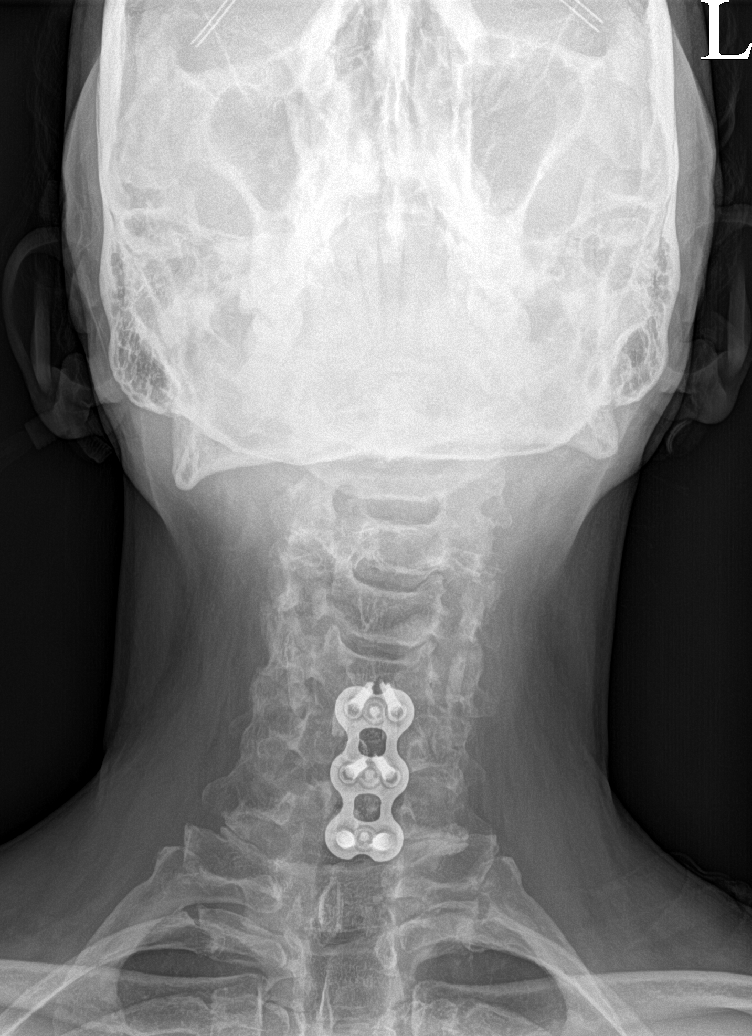

[c-spine open mouth]
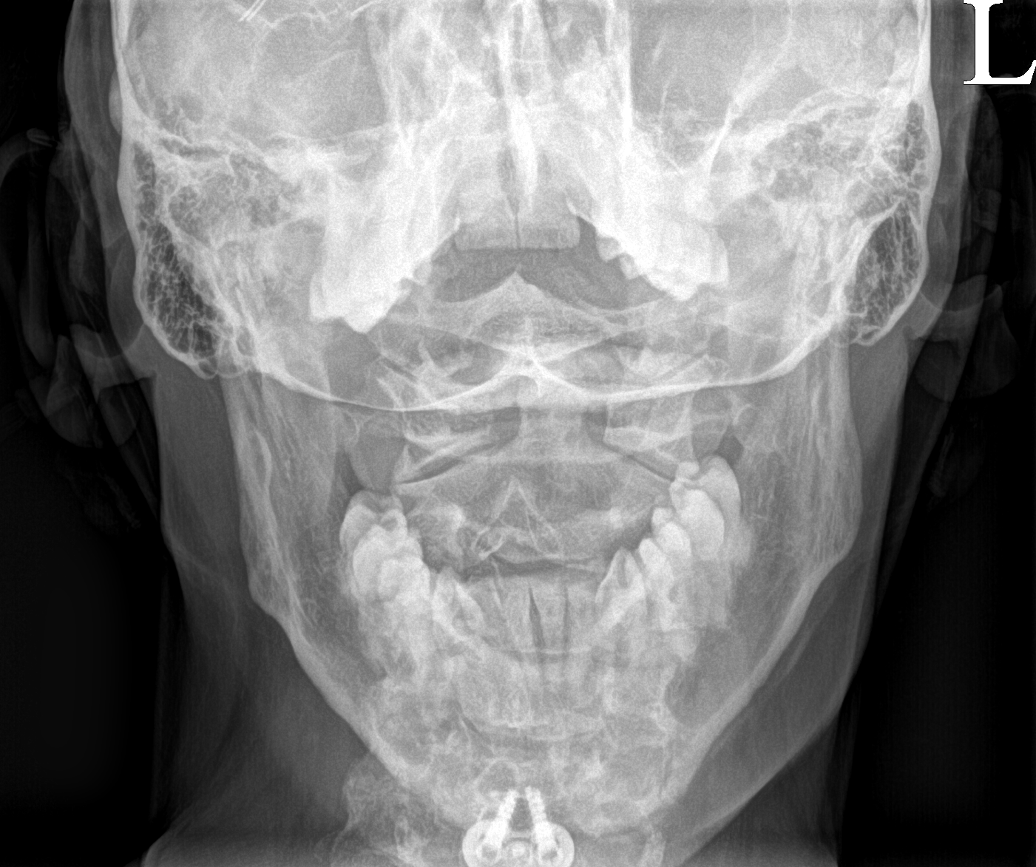

[5 of 5 positions shown; findings below may reference images not displayed]

FINDINGS: The lateral view images through the bottom of T1. Prevertebral soft
tissues are within normal limits. Anterior fixation at C5-7.
Maintenance of vertebral body height and alignment. No hardware
complication. Advanced spondylosis, including at C3-4, C4-5.
Apparent bilateral neural foraminal narrowing, including at C3-4 on
the left and C5-6, C7-T1 on the right.
IMPRESSION: Spondylosis and prior anterior fixation. No acute osseous
abnormality.

## 2022-05-09 ENCOUNTER — Other Ambulatory Visit: Payer: Self-pay | Admitting: Physician Assistant

## 2022-05-09 DIAGNOSIS — M48061 Spinal stenosis, lumbar region without neurogenic claudication: Secondary | ICD-10-CM

## 2022-05-26 ENCOUNTER — Ambulatory Visit
Admission: RE | Admit: 2022-05-26 | Discharge: 2022-05-26 | Disposition: A | Discharge status: Home / Self Care | Source: Ambulatory Visit | Attending: Physician Assistant | Admitting: Physician Assistant

## 2022-05-26 DIAGNOSIS — M48061 Spinal stenosis, lumbar region without neurogenic claudication: Secondary | ICD-10-CM

## 2022-05-26 MED ORDER — METHYLPREDNISOLONE ACETATE 40 MG/ML INJ SUSP (RADIOLOG
80.0000 mg | Freq: Once | INTRAMUSCULAR | Status: AC
  Administered 2022-05-26: 80 mg via EPIDURAL

## 2022-05-26 MED ORDER — IOPAMIDOL (ISOVUE-M 200) INJECTION 41%
1.0000 mL | Freq: Once | INTRAMUSCULAR | Status: AC
  Administered 2022-05-26: 1 mL via EPIDURAL

## 2022-05-26 NOTE — Discharge Instructions (Signed)

## 2022-07-11 ENCOUNTER — Other Ambulatory Visit: Payer: Self-pay | Admitting: Nurse Practitioner

## 2022-07-11 DIAGNOSIS — Z981 Arthrodesis status: Secondary | ICD-10-CM

## 2022-08-05 ENCOUNTER — Ambulatory Visit
Admission: RE | Admit: 2022-08-05 | Discharge: 2022-08-05 | Disposition: A | Discharge status: Home / Self Care | Source: Ambulatory Visit | Attending: Nurse Practitioner | Admitting: Nurse Practitioner

## 2022-08-05 DIAGNOSIS — Z981 Arthrodesis status: Secondary | ICD-10-CM

## 2022-08-17 ENCOUNTER — Other Ambulatory Visit: Payer: Self-pay | Admitting: Physician Assistant

## 2022-08-17 DIAGNOSIS — M48061 Spinal stenosis, lumbar region without neurogenic claudication: Secondary | ICD-10-CM

## 2022-08-25 ENCOUNTER — Ambulatory Visit
Admission: RE | Admit: 2022-08-25 | Discharge: 2022-08-25 | Disposition: A | Discharge status: Home / Self Care | Source: Ambulatory Visit | Attending: Physician Assistant | Admitting: Physician Assistant

## 2022-08-25 DIAGNOSIS — M48061 Spinal stenosis, lumbar region without neurogenic claudication: Secondary | ICD-10-CM

## 2022-08-25 MED ORDER — METHYLPREDNISOLONE ACETATE 40 MG/ML INJ SUSP (RADIOLOG
80.0000 mg | Freq: Once | INTRAMUSCULAR | Status: AC
  Administered 2022-08-25: 80 mg via EPIDURAL

## 2022-08-25 MED ORDER — IOPAMIDOL (ISOVUE-M 200) INJECTION 41%
1.0000 mL | Freq: Once | INTRAMUSCULAR | Status: AC
  Administered 2022-08-25: 1 mL via EPIDURAL

## 2022-08-25 NOTE — Discharge Instructions (Signed)

## 2022-09-09 ENCOUNTER — Other Ambulatory Visit

## 2022-11-15 ENCOUNTER — Encounter: Payer: Self-pay | Admitting: Physician Assistant

## 2022-11-15 ENCOUNTER — Other Ambulatory Visit: Payer: Self-pay | Admitting: Physician Assistant

## 2022-11-15 DIAGNOSIS — M48061 Spinal stenosis, lumbar region without neurogenic claudication: Secondary | ICD-10-CM

## 2022-11-24 ENCOUNTER — Ambulatory Visit
Admission: RE | Admit: 2022-11-24 | Discharge: 2022-11-24 | Disposition: A | Discharge status: Home / Self Care | Source: Ambulatory Visit | Attending: Physician Assistant | Admitting: Physician Assistant

## 2022-11-24 DIAGNOSIS — M48061 Spinal stenosis, lumbar region without neurogenic claudication: Secondary | ICD-10-CM

## 2022-11-24 MED ORDER — IOPAMIDOL (ISOVUE-M 200) INJECTION 41%
1.0000 mL | Freq: Once | INTRAMUSCULAR | Status: AC
  Administered 2022-11-24: 1 mL via EPIDURAL

## 2022-11-24 MED ORDER — METHYLPREDNISOLONE ACETATE 40 MG/ML INJ SUSP (RADIOLOG
80.0000 mg | Freq: Once | INTRAMUSCULAR | Status: AC
  Administered 2022-11-24: 80 mg via EPIDURAL

## 2022-11-24 NOTE — Discharge Instructions (Signed)

## 2023-03-16 ENCOUNTER — Other Ambulatory Visit: Payer: Self-pay | Admitting: Pain Medicine

## 2023-03-16 DIAGNOSIS — M549 Dorsalgia, unspecified: Secondary | ICD-10-CM

## 2023-03-16 DIAGNOSIS — G8929 Other chronic pain: Secondary | ICD-10-CM

## 2023-04-10 ENCOUNTER — Ambulatory Visit
Admission: RE | Admit: 2023-04-10 | Discharge: 2023-04-10 | Disposition: A | Discharge status: Home / Self Care | Source: Ambulatory Visit | Attending: Pain Medicine | Admitting: Pain Medicine

## 2023-04-10 DIAGNOSIS — M549 Dorsalgia, unspecified: Secondary | ICD-10-CM

## 2023-04-10 DIAGNOSIS — G8929 Other chronic pain: Secondary | ICD-10-CM

## 2023-05-09 NOTE — Progress Notes (Signed)
 [facility] [address] [place] [address] Phone: [phone]   Assessment and Plan:     1. Bilateral hand pain 2. History of fusion of cervical spine 3. Polyarthralgia 4. Cervical spondylosis with radiculopathy  -Chronic with exacerbation, initial sports medicine visit - Patient presents with worsening bilateral hand pain and weakness over the past 3 to 6 months, typically worse in left compared to right.  I feel patient's symptoms are likely due to a central, cervical cause with history of cervical spine fusion, cervical stenosis with radiculopathy - X-ray obtained in clinic.  My interpretation: No acute fracture or dislocation.  No findings of explain patient's bilateral hand pain - Due to progressive weakness, and multiple areas of pain, and patient's report of positive ANA in the past, we will further evaluate for autoimmune causes of patient's symptoms with lab work today's visit - Start HEP for hands.  Recommend using finger extensor bands and gripping putty to improve overall strength and intrinsic hand muscles  15 additional minutes spent for educating Therapeutic Home Exercise Program.  This included exercises focusing on stretching, strengthening, with focus on eccentric aspects.   Long term goals include an improvement in range of motion, strength, endurance as well as avoiding reinjury. Patient's frequency would include in 1-2 times a day, 3-5 times a week for a duration of 6-12 weeks. Proper technique shown and discussed handout in great detail with ATC.  All questions were discussed and answered.     Pertinent previous records reviewed include thoracic MRI 04/10/2023, MRI cervical spine 08/05/22, [facility] neurosurgery note 04/07/2023  Follow Up: As needed if no improvement or worsening of symptoms.  I will reach out with lab results once completed   Subjective:   I, Jasmine Zimmerman, am serving as a neurosurgeon for Doctor Morene Mace   Chief Complaint: left shoulder pain    HPI:    07/16/2020 Patient does have signs and symptoms consistent with more of a shoulder bursitis.  The ultrasound does appear to have more calcific changes it does seem to be more of a chronic.  We discussed icing regimen and home exercises.  Continuing the Effexor .  Differential does include possible cervical radiculopathy and we will get x-rays.  Patient has had low vitamin D  previously and has responded well to the once weekly vitamin D .  We will check vitamin D  levels today.  Patient given exercises that I think will be beneficial.  Patient will come back in 4 to 6 weeks.  Continue to have pain consider formal physical therapy and injection.   Degenerative disc cervical.  Patient does have a history of cervical fusion.  We will continue to monitor.  Continue the Effexor .  Patient unable to tolerate the gabapentin    Updated 04/07/2021 Jasmine Zimmerman is a 58 y.o. female coming in with complaint of L shoulder pain. Patient states that L shoulder pain increased this week. Last night her pain was so bad that she almost went into ED. Pain mostly in posterior deltoid and scapula. On Monday her arm became numb and tingly. Patient using ice, heat, IBU and inversion table. Pain currently 6/10 that radiates down into fingertips. No decrease in strength. Driving to [employer]    04/12/21 Patient states she saw smith on 12/7 for left arm pain and numbness/tingling. She said that it has continued and she is starting to worry about possible nerve damage. She has had something similar before and sees a neurologist  at [facility].   Cervical spine MRI 02/21/2021 IMPRESSION: 1. Cervical spine degeneration especially affecting facets with multilevel anterolisthesis and active arthritis on the right at C4-5, usually symptomatic. 2. Foraminal stenosis at C3-4, C4-5, and on the left at C7-T1. 3. Solid arthrodesis at C5-C7.   Lumbar spine MRI  02/21/2021 IMPRESSION: Degeneration at L3-4 and below with compressive spinal stenosis at L4-5 and especially L3-4.  05/10/2023 Patient states she is having bilat hand pain worse on left than right . Pain with opening and closing hands constant pain but intermittent intensity of pain. Hx of cervical fusion . Does endorse numbness and tingling. Naproxen intermittent on days when she works. Decreased grip strength. 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   Current Outpatient Medications:    gabapentin  (NEURONTIN ) 300 MG capsule, Take 1 capsule (300 mg total) by mouth at bedtime., Disp: 90 capsule, Rfl: 0   Methocarbamol (ROBAXIN PO), Take 750 mg by mouth 3 (three) times daily as needed., Disp: , Rfl:    venlafaxine  XR (EFFEXOR -XR) 37.5 MG 24 hr capsule, TAKE ONE CAPSULE BY MOUTH DAILY WITH BREAKFAST, Disp: 30 capsule, Rfl: 0   Objective:     Vitals:   05/10/23 1251  BP: 118/78  Pulse: 68  SpO2: 98%  Weight: 145 lb (65.8 kg)  Height: 5' 4 (1.626 m)      Body mass index is 24.89 kg/m.    Physical Exam:    General: Appears well, nad, nontoxic and pleasant Neuro:sensation intact, strength is 5/5 in upper extremities, muscle tone wnl Skin:no susupicious lesions or rashes   Bilateral hand/wrist:   No deformity or swelling appreciated. ROM  Ext 90, flexion 70, radial/ulnar deviation 30 TTP bilateral IP (worse on left) and first MCP (worse on right) palpable nodule at the palmar surface of first MCP with flexion and extension of right first digit nttp over the snuff box, dorsal carpals, volar carpals, radial styloid, ulnar styloid, 1st mcp, tfcc Negative Tinel's Negative finklestein Neg tfcc bounce test Mild diffuse pain with resisted ext, flex or deviation    Electronically signed by:  Odis Mace D.CLEMENTEEN [facility] 5:03 PM 05/10/23

## 2023-05-10 ENCOUNTER — Ambulatory Visit

## 2023-05-10 ENCOUNTER — Ambulatory Visit (INDEPENDENT_AMBULATORY_CARE_PROVIDER_SITE_OTHER): Admitting: Sports Medicine

## 2023-05-10 VITALS — BP 118/78 | HR 68 | Ht 64.0 in | Wt 145.0 lb

## 2023-05-10 DIAGNOSIS — M4722 Other spondylosis with radiculopathy, cervical region: Secondary | ICD-10-CM | POA: Diagnosis not present

## 2023-05-10 DIAGNOSIS — M79642 Pain in left hand: Secondary | ICD-10-CM | POA: Diagnosis not present

## 2023-05-10 DIAGNOSIS — M79641 Pain in right hand: Secondary | ICD-10-CM

## 2023-05-10 DIAGNOSIS — Z981 Arthrodesis status: Secondary | ICD-10-CM

## 2023-05-10 DIAGNOSIS — M2550 Pain in unspecified joint: Secondary | ICD-10-CM

## 2023-05-10 LAB — COMPREHENSIVE METABOLIC PANEL
ALT: 17 U/L (ref 0–35)
AST: 24 U/L (ref 0–37)
Albumin: 5 g/dL (ref 3.5–5.2)
Alkaline Phosphatase: 77 U/L (ref 39–117)
BUN: 15 mg/dL (ref 6–23)
CO2: 30 meq/L (ref 19–32)
Calcium: 9.8 mg/dL (ref 8.4–10.5)
Chloride: 101 meq/L (ref 96–112)
Creatinine, Ser: 0.72 mg/dL (ref 0.40–1.20)
GFR: 92.63 mL/min (ref >60.00)
Glucose, Bld: 93 mg/dL (ref 70–99)
Potassium: 4.4 meq/L (ref 3.5–5.1)
Sodium: 141 meq/L (ref 135–145)
Total Bilirubin: 0.3 mg/dL (ref 0.2–1.2)
Total Protein: 7.8 g/dL (ref 6.0–8.3)

## 2023-05-10 LAB — CBC WITH DIFFERENTIAL/PLATELET
Basophils Absolute: 0.1 10*3/uL (ref 0.0–0.1)
Basophils Relative: 1.3 % (ref 0.0–3.0)
Eosinophils Absolute: 0.2 10*3/uL (ref 0.0–0.7)
Eosinophils Relative: 3.3 % (ref 0.0–5.0)
HCT: 42.9 % (ref 36.0–46.0)
Hemoglobin: 14.2 g/dL (ref 12.0–15.0)
Lymphocytes Relative: 24.8 % (ref 12.0–46.0)
Lymphs Abs: 1.5 10*3/uL (ref 0.7–4.0)
MCHC: 33.2 g/dL (ref 30.0–36.0)
MCV: 92 fL (ref 78.0–100.0)
Monocytes Absolute: 0.3 10*3/uL (ref 0.1–1.0)
Monocytes Relative: 5 % (ref 3.0–12.0)
Neutro Abs: 3.8 10*3/uL (ref 1.4–7.7)
Neutrophils Relative %: 65.6 % (ref 43.0–77.0)
Platelets: 261 10*3/uL (ref 150.0–400.0)
RBC: 4.67 Mil/uL (ref 3.87–5.11)
RDW: 13.1 % (ref 11.5–15.5)
WBC: 5.8 10*3/uL (ref 4.0–10.5)

## 2023-05-10 LAB — C-REACTIVE PROTEIN: CRP: <1 mg/dL (ref 0.5–20.0)

## 2023-05-10 LAB — URIC ACID: Uric Acid, Serum: 3.1 mg/dL (ref 2.4–7.0)

## 2023-05-10 LAB — TSH: TSH: 2.41 u[IU]/mL (ref 0.35–5.50)

## 2023-05-10 LAB — VITAMIN D 25 HYDROXY (VIT D DEFICIENCY, FRACTURES): VITD: 80.37 ng/mL (ref 30.00–100.00)

## 2023-05-10 LAB — FERRITIN: Ferritin: 29 ng/mL (ref 10.0–291.0)

## 2023-05-10 LAB — SEDIMENTATION RATE: Sed Rate: 8 mm/h (ref 0–30)

## 2023-05-10 NOTE — Patient Instructions (Addendum)
 Labs on the way out  Recommend getting putty or clay to manipulate with hands to regain strength  Recommend getting finger grip  Hand HEP  As needed follow up... we will reach out with lab results

## 2023-05-11 LAB — ANA+ENA+DNA/DS+SCL 70+SJOSSA/B
ANA Titer 1: POSITIVE — AB
ENA RNP Ab: <0.2 AI (ref 0.0–0.9)
ENA SM Ab Ser-aCnc: <0.2 AI (ref 0.0–0.9)
ENA SSA (RO) Ab: 1.3 AI — ABNORMAL HIGH (ref 0.0–0.9)
ENA SSB (LA) Ab: <0.2 AI (ref 0.0–0.9)
Scleroderma (Scl-70) (ENA) Antibody, IgG: <0.2 AI (ref 0.0–0.9)
dsDNA Ab: <1 [IU]/mL (ref 0–9)

## 2023-05-11 LAB — FANA STAINING PATTERNS: Homogeneous Pattern: 1:160 {titer} — ABNORMAL HIGH

## 2023-05-13 LAB — RHEUMATOID FACTOR: Rheumatoid fact SerPl-aCnc: <10 [IU]/mL (ref <14)

## 2023-05-13 LAB — CYCLIC CITRUL PEPTIDE ANTIBODY, IGG: Cyclic Citrullin Peptide Ab: <16 U

## 2023-05-15 NOTE — Progress Notes (Signed)
 Called and spoke with patient.  Reviewed lab work which included positive ANA titer 1: 160 and elevated anti-Ro at 1.3.  Findings consistent with Sjogren's disease which consists of dry eyes and dry mouth.  This is a finding that had been found and discussed years ago with rheumatology.  Dry eyes and dry mouth are not patient's most concerning symptoms at this time.  No findings that would explain patient's ongoing hand pain.  Discussed that we could refer to rheumatology or new neurosurgery for second opinion.  Patient states she will reach back out once she has done some research and let us  know where she would like to be referred.  No further changes to plan at this time.

## 2023-06-07 IMAGING — XA Imaging study
2 series · 2 of 2 positions shown · non-contrast
Comparison: none

CLINICAL DATA: Lumbosacral spondylosis without myelopathy. Moderate
improvement following prior epidural injections. Recurrent bilateral
low back, hip, and right leg pain. Epidural injection requested at
L3-4.

[Series 1: ortho standard · 1 of 1 slices shown (1 of 2)]
[im 1/1]
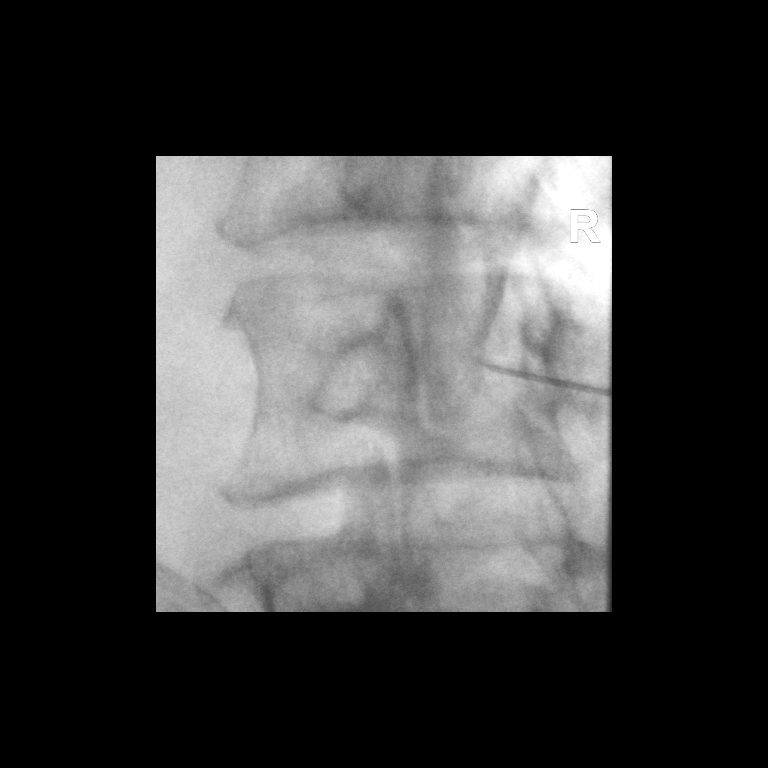

[Series 2: ortho standard · 1 of 1 slices shown (2 of 2)]
[im 1/1]
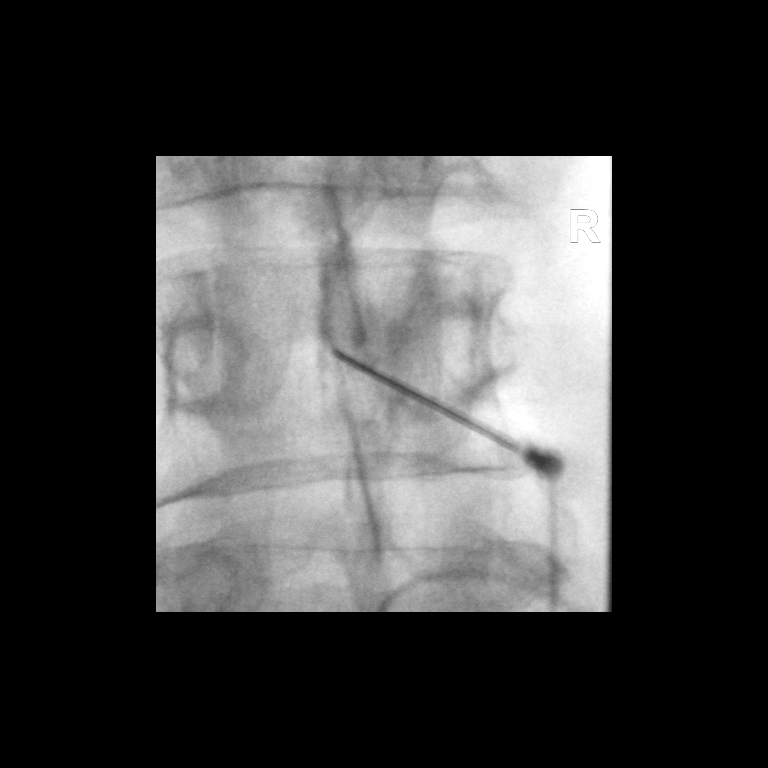

[2 of 2 positions shown; findings below may reference images not displayed]

FLUOROSCOPY:
Radiation Exposure Index (as provided by the fluoroscopic device):
2.20 mGy Kerma

PROCEDURE:
The procedure, risks, benefits, and alternatives were explained to
the patient. Questions regarding the procedure were encouraged and
answered. The patient understands and consents to the procedure.

LUMBAR EPIDURAL INJECTION:

An interlaminar approach was performed on the right at L3-4. The
overlying skin was cleansed and anesthetized. A 3.5 inch 20 gauge
epidural needle was advanced using loss-of-resistance technique.

DIAGNOSTIC EPIDURAL INJECTION:

Injection of Isovue-M 200 initially demonstrated spread dorsal to
the ligament and possibly in the retrodural space of Garate. The
needle was advanced further until a second, more robust loss of
resistance was achieved, and injection at this location showed a
good epidural pattern with spread above and below the level of
needle placement, primarily on the right. No vascular opacification
is seen.

THERAPEUTIC EPIDURAL INJECTION:

80 mg of Depo-Medrol mixed with 3 mL of 1% lidocaine were instilled.
The procedure was well-tolerated, and the patient was discharged
thirty minutes following the injection in good condition.

COMPLICATIONS:
None immediate
IMPRESSION: Technically successful interlaminar epidural injection on the right
at L3-4.

## 2023-09-14 NOTE — Progress Notes (Unsigned)
    Ben Jackson D.[facility] [address] [place] [address] Phone: [phone]   Assessment and Plan:     There are no diagnoses linked to this encounter.  ***   Pertinent previous records reviewed include ***    Follow Up: ***     Subjective:   I, Jayonna Meyering, am serving as a Neurosurgeon for Doctor Ulysees Gander  Chief Complaint: left shoulder   HPI:   09/15/2023 Patient is a 58 year old female with left shoulder pain. Patient states   Relevant Historical Information: ***  Additional pertinent review of systems negative.   Current Outpatient Medications:    gabapentin  (NEURONTIN ) 300 MG capsule, Take 1 capsule (300 mg total) by mouth at bedtime., Disp: 90 capsule, Rfl: 0   Methocarbamol (ROBAXIN PO), Take 750 mg by mouth 3 (three) times daily as needed., Disp: , Rfl:    venlafaxine  XR (EFFEXOR -XR) 37.5 MG 24 hr capsule, TAKE ONE CAPSULE BY MOUTH DAILY WITH BREAKFAST, Disp: 30 capsule, Rfl: 0   Objective:     There were no vitals filed for this visit.    There is no height or weight on file to calculate BMI.    Physical Exam:    ***   Electronically signed by:  Marshall Skeeter D.[facility] 7:43 AM 09/14/23

## 2023-09-15 ENCOUNTER — Ambulatory Visit: Admitting: Sports Medicine

## 2023-09-15 VITALS — BP 110/70 | HR 72 | Ht 64.0 in | Wt 142.0 lb

## 2023-09-15 DIAGNOSIS — S46812A Strain of other muscles, fascia and tendons at shoulder and upper arm level, left arm, initial encounter: Secondary | ICD-10-CM | POA: Diagnosis not present

## 2023-09-15 DIAGNOSIS — R200 Anesthesia of skin: Secondary | ICD-10-CM | POA: Diagnosis not present

## 2023-09-15 DIAGNOSIS — M25512 Pain in left shoulder: Secondary | ICD-10-CM | POA: Diagnosis not present

## 2023-09-15 DIAGNOSIS — G8929 Other chronic pain: Secondary | ICD-10-CM | POA: Diagnosis not present

## 2023-09-15 DIAGNOSIS — R202 Paresthesia of skin: Secondary | ICD-10-CM

## 2023-09-15 MED ORDER — METHYLPREDNISOLONE 4 MG PO TBPK: ORAL_TABLET | ORAL | 0 refills | Status: AC

## 2023-09-15 MED ORDER — IBUPROFEN 800 MG PO TABS: 800.0000 mg | ORAL_TABLET | Freq: Three times a day (TID) | ORAL | 1 refills | Status: AC | PRN

## 2023-09-15 NOTE — Patient Instructions (Signed)
 Prednisone  dos pak can start Monday 09/18/2023  if not improving  3 week follow up

## 2023-10-05 NOTE — Progress Notes (Deleted)
    Ben Jackson D.[facility] [address] [place] [address] Phone: [phone]   Assessment and Plan:     There are no diagnoses linked to this encounter.  ***   Pertinent previous records reviewed include ***    Follow Up: ***     Subjective:   I, Eura Radabaugh, am serving as a Neurosurgeon for Doctor Ulysees Gander   Chief Complaint: left shoulder    HPI:    09/15/2023 Patient is a 58 year old female with left shoulder pain. Patient states think she has a pinched nerve. Constant numbness and tingling. Past 3 weeks she feels a knot on her scap. Numbness and tingling feels worse. Isn't able to sleep. Muscle relaxer, naproxen, motrin , heat and ice don't help. ROM decreased due to numbness. 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   Current Outpatient Medications:    gabapentin  (NEURONTIN ) 300 MG capsule, Take 1 capsule (300 mg total) by mouth at bedtime., Disp: 90 capsule, Rfl: 0   ibuprofen  (ADVIL ) 800 MG tablet, Take 1 tablet (800 mg total) by mouth every 8 (eight) hours as needed., Disp: 30 tablet, Rfl: 1   Methocarbamol (ROBAXIN PO), Take 750 mg by mouth 3 (three) times daily as needed., Disp: , Rfl:    methylPREDNISolone  (MEDROL  DOSEPAK) 4 MG TBPK tablet, Take 6 tablets on day 1.  Take 5 tablets on day 2.  Take 4 tablets on day 3.  Take 3 tablets on day 4.  Take 2 tablets on day 5.  Take 1 tablet on day 6., Disp: 21 tablet, Rfl: 0   venlafaxine  XR (EFFEXOR -XR) 37.5 MG 24 hr capsule, TAKE ONE CAPSULE BY MOUTH DAILY WITH BREAKFAST, Disp: 30 capsule, Rfl: 0   Objective:     There were no vitals filed for this visit.    There is no height or weight on file to calculate BMI.    Physical Exam:    ***   Electronically signed by:  Marshall Skeeter D.[facility] 7:43 AM 10/05/23

## 2023-10-06 ENCOUNTER — Ambulatory Visit: Admitting: Sports Medicine

## 2024-02-19 ENCOUNTER — Encounter: Payer: Self-pay | Admitting: Sports Medicine

## 2024-02-19 NOTE — Telephone Encounter (Signed)
 Note sent via mychart

## 2024-03-21 ENCOUNTER — Other Ambulatory Visit: Payer: Self-pay | Admitting: Physician Assistant

## 2024-03-21 DIAGNOSIS — M5416 Radiculopathy, lumbar region: Secondary | ICD-10-CM

## 2024-03-21 DIAGNOSIS — M48061 Spinal stenosis, lumbar region without neurogenic claudication: Secondary | ICD-10-CM
